# Patient Record
Sex: Male | Born: 1945 | Race: White | Hispanic: No | Marital: Married | Smoking: Never smoker
Health system: Southern US, Community
[De-identification: ages and names within clinical notes are randomized; demographics above are authoritative.]

## PROBLEM LIST (undated history)

## (undated) DIAGNOSIS — E785 Hyperlipidemia, unspecified: Secondary | ICD-10-CM

## (undated) DIAGNOSIS — I2510 Atherosclerotic heart disease of native coronary artery without angina pectoris: Secondary | ICD-10-CM

## (undated) HISTORY — PX: CARDIAC CATHETERIZATION: SHX172

---

## 2020-01-01 ENCOUNTER — Ambulatory Visit (INDEPENDENT_AMBULATORY_CARE_PROVIDER_SITE_OTHER): Payer: Medicare HMO

## 2020-01-01 ENCOUNTER — Encounter: Payer: Self-pay | Admitting: Orthopaedic Surgery

## 2020-01-01 ENCOUNTER — Ambulatory Visit: Payer: Medicare HMO | Admitting: Orthopaedic Surgery

## 2020-01-01 DIAGNOSIS — M25571 Pain in right ankle and joints of right foot: Secondary | ICD-10-CM | POA: Diagnosis not present

## 2020-01-01 DIAGNOSIS — G8929 Other chronic pain: Secondary | ICD-10-CM | POA: Diagnosis not present

## 2020-01-01 DIAGNOSIS — M25561 Pain in right knee: Secondary | ICD-10-CM

## 2020-01-01 MED ORDER — BUPIVACAINE HCL 0.5 % IJ SOLN
2.0000 mL | INTRAMUSCULAR | Status: AC | PRN
  Administered 2020-01-01: 2 mL via INTRA_ARTICULAR

## 2020-01-01 MED ORDER — LIDOCAINE HCL 1 % IJ SOLN
2.0000 mL | INTRAMUSCULAR | Status: AC | PRN
  Administered 2020-01-01: 2 mL

## 2020-01-01 MED ORDER — METHYLPREDNISOLONE ACETATE 40 MG/ML IJ SUSP
40.0000 mg | INTRAMUSCULAR | Status: AC | PRN
  Administered 2020-01-01: 40 mg via INTRA_ARTICULAR

## 2020-01-01 NOTE — Progress Notes (Signed)
Office Visit Note   Patient: Jesus Edwards           Date of Birth: 25-Mar-1945           MRN: [id number] Visit Date: 01/01/2020              Requested by: No referring provider defined for this encounter. PCP: No primary care provider on file.   Assessment & Plan: Visit Diagnoses:  1. Pain in right ankle and joints of right foot   2. Chronic pain of right knee     Plan: Impression is chronic right knee and ankle pain.  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  I was able to aspirate approximately 25 cc of joint fluid from the right knee and cortisone was injected.  I am concerned that he may have a meniscal tear in addition to the degenerative joint disease therefore we will order an MRI of the right knee to evaluate for this.  For the ankle cortisone injections have given temporary relief and I suspect that there may be loose bodies in the joint therefore we will order an MRI of the right ankle as well.  Follow-up after the MRIs.  Follow-Up Instructions: Return if symptoms worsen or fail to improve.   Orders:  Orders Placed This Encounter  Procedures  . XR KNEE 3 VIEW RIGHT  . XR Ankle Complete Right   No orders of the defined types were placed in this encounter.     Procedures: Large Joint Inj: R knee on 01/01/2020 4:58 PM Indications: pain Details: 22 G needle  Arthrogram: No  Medications: 40 mg methylPREDNISolone acetate 40 MG/ML; 2 mL lidocaine 1 %; 2 mL bupivacaine 0.5 % Consent was given by the patient. Patient was prepped and draped in the usual sterile fashion.       Clinical Data: No additional findings.   Subjective: Chief Complaint  Patient presents with  . Right Ankle - Pain  . Right Knee - Pain    Jesus Edwards is a 74 year old gentleman here for second opinion for evaluation of chronic right knee and right ankle pain.  She he had an injury in February of this year which she had a mechanical fall.  He was originally evaluated  comp rehab at [facility] and was unhappy with the care.  He did undergo knee aspiration injection which helped temporarily.  He has noticed that he continues to have pain that radiates down the leg and start up stiffness and pain and he will walk with the foot turned externally.  He denies any numbness and tingling or back pain.  Tylenol as needed.  He took tramadol originally but no longer takes it.   Review of Systems  Constitutional: Negative.   All other systems reviewed and are negative.    Objective: Vital Signs: There were no vitals taken for this visit.  Physical Exam Vitals and nursing note reviewed.  Constitutional:      Appearance: He is well-developed.  HENT:     Head: Normocephalic and atraumatic.  Eyes:     Pupils: Pupils are equal, round, and reactive to light.  Pulmonary:     Effort: Pulmonary effort is normal.  Abdominal:     Palpations: Abdomen is soft.  Musculoskeletal:        General: Normal range of motion.     Cervical back: Neck supple.  Skin:    General: Skin is warm.  Neurological:  Mental Status: He is alert and oriented to person, place, and time.  Psychiatric:        Behavior: Behavior normal.        Thought Content: Thought content normal.        Judgment: Judgment normal.     Ortho Exam Right knee shows a moderate joint effusion with a slight flexion contracture.  Collaterals and cruciates are stable.  2+ patellofemoral crepitus.  Right ankle shows no effusion.  He has no significant limitation range of motion.  No tenderness palpation.  Specialty Comments:  No specialty comments available.  Imaging: XR Ankle Complete Right  Result Date: 01/01/2020 Moderate ankle arthritis possible loose bodies.  XR KNEE 3 VIEW RIGHT  Result Date: 01/01/2020 Advanced patellofemoral arthritis.  Moderate femoral-tibial arthritis.    PMFS History: There are no problems to display for this patient.  History reviewed. No pertinent past medical  history.  History reviewed. No pertinent family history.  History reviewed. No pertinent surgical history. Social History   Occupational History  . Not on file  Tobacco Use  . Smoking status: Never Smoker  . Smokeless tobacco: Never Used  Substance and Sexual Activity  . Alcohol use: Not on file  . Drug use: Not on file  . Sexual activity: Not on file

## 2020-01-02 NOTE — Addendum Note (Signed)
Addended by: Albertina Parr on: 01/02/2020 11:17 AM   Modules accepted: Orders

## 2020-01-16 ENCOUNTER — Telehealth: Payer: Self-pay

## 2020-01-16 NOTE — Telephone Encounter (Signed)
Please advise. Thanks.  

## 2020-01-16 NOTE — Telephone Encounter (Signed)
Patient called he stated his knee is in pain he is out of town and is requesting rx refill for tramadol he is requesting rx to be sent to [facility] in [place]. Pharmacy number:[phone] Pharmacy address: [address], [place], [place] [address] [phone]

## 2020-01-17 ENCOUNTER — Other Ambulatory Visit: Payer: Self-pay | Admitting: Physician Assistant

## 2020-01-17 MED ORDER — TRAMADOL HCL 50 MG PO TABS
50.0000 mg | ORAL_TABLET | Freq: Two times a day (BID) | ORAL | 0 refills | Status: DC | PRN
Start: 2020-01-17 — End: 2020-01-21

## 2020-01-17 NOTE — Telephone Encounter (Signed)
I just sent in

## 2020-01-21 ENCOUNTER — Telehealth: Payer: Self-pay

## 2020-01-21 ENCOUNTER — Other Ambulatory Visit: Payer: Self-pay | Admitting: Physician Assistant

## 2020-01-21 MED ORDER — TRAMADOL HCL 50 MG PO TABS
50.0000 mg | ORAL_TABLET | Freq: Two times a day (BID) | ORAL | 0 refills | Status: DC | PRN
Start: 2020-01-21 — End: 2020-04-04

## 2020-01-21 NOTE — Telephone Encounter (Signed)
Patient aware this was called in  

## 2020-01-21 NOTE — Telephone Encounter (Signed)
Patient called in for refill on tramdol. Says he is in [place] right now and would like to [facility]    [address] [address], [place], [place] [address] CB:[phone]

## 2020-01-21 NOTE — Telephone Encounter (Signed)
I just sent in

## 2020-01-21 NOTE — Telephone Encounter (Signed)
See below

## 2020-01-31 ENCOUNTER — Ambulatory Visit
Admission: RE | Admit: 2020-01-31 | Discharge: 2020-01-31 | Disposition: A | Discharge status: Home / Self Care | Payer: Medicare HMO | Source: Ambulatory Visit | Attending: Orthopaedic Surgery | Admitting: Orthopaedic Surgery

## 2020-01-31 DIAGNOSIS — G8929 Other chronic pain: Secondary | ICD-10-CM

## 2020-01-31 DIAGNOSIS — M25571 Pain in right ankle and joints of right foot: Secondary | ICD-10-CM

## 2020-01-31 IMAGING — MR MR KNEE*R* W/O CM
4 of 6 series · 18 of 40 positions shown · non-contrast
Comparison: [DATE] radiographs

CLINICAL DATA: Right knee pain. Fall 2 years ago. Weakness.
Osteoarthritis.

EXAM:
MRI OF THE RIGHT KNEE WITHOUT CONTRAST
TECHNIQUE: Multiplanar, multisequence MR imaging of the knee was performed. No
intravenous contrast was administered.

[Series 3: T2 fat-sat · axial · 4.0mm · 0.29mm/px · z∈[-64,+24]mm · 3 of 30 slices shown (1 of 2)]
[im 5/30]
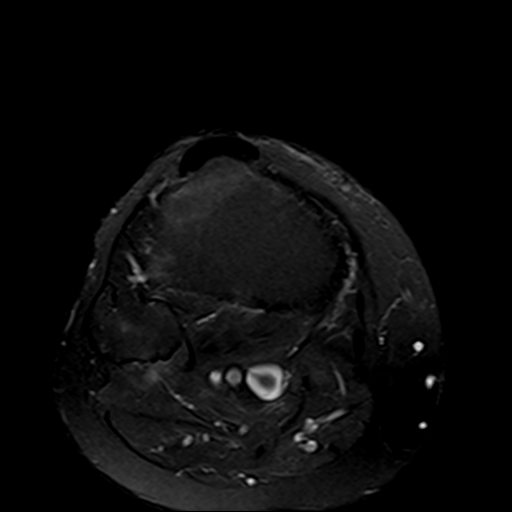
[im 15/30]
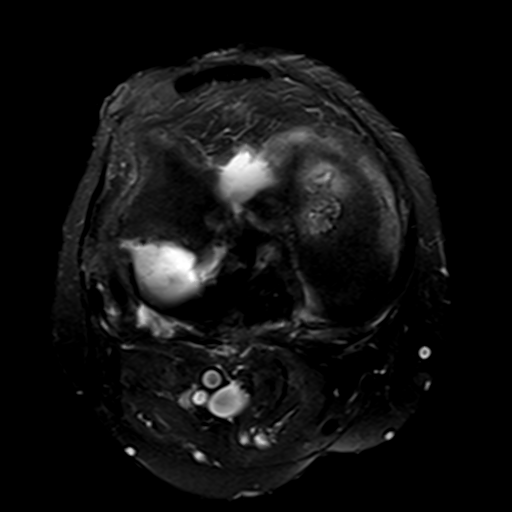
[im 25/30]
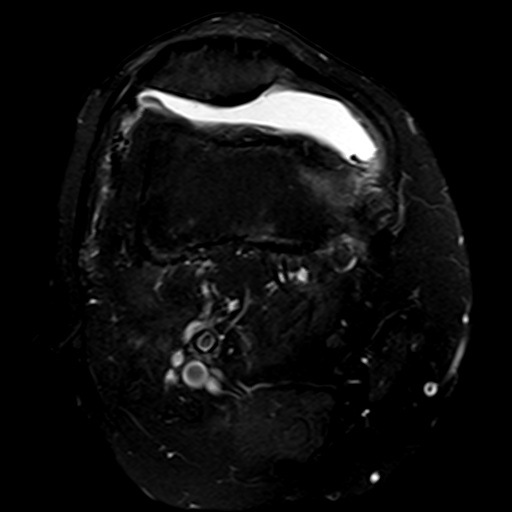

[Series 5: T2 fat-sat · coronal · 4.0mm · 0.29mm/px · 3 of 27 slices shown (2 of 2)]
[im 6/27]
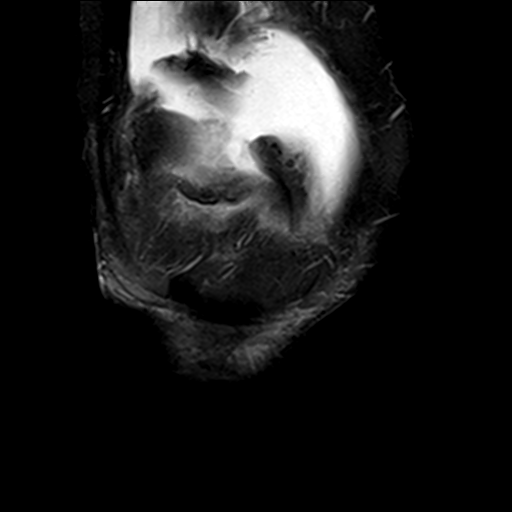
[im 16/27]
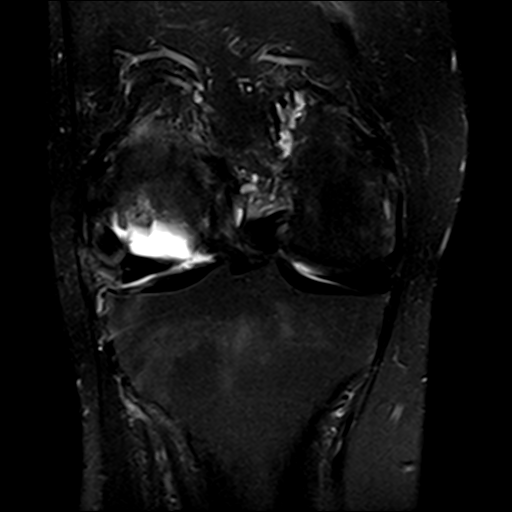
[im 27/27]
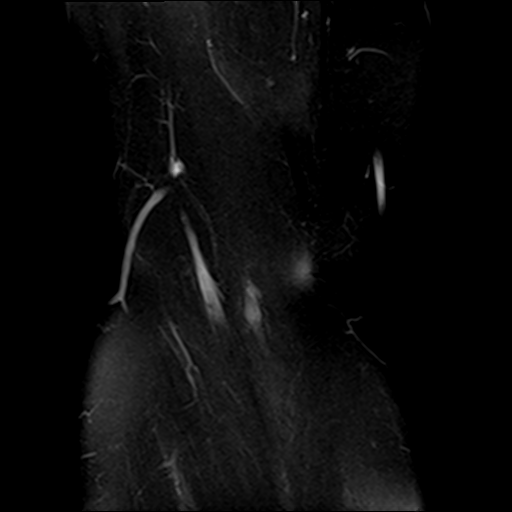

[Series 6: PD fat-sat · coronal · 3.0mm · 0.29mm/px · 7 of 28 slices shown (1 of 2)]
[im 1/28]
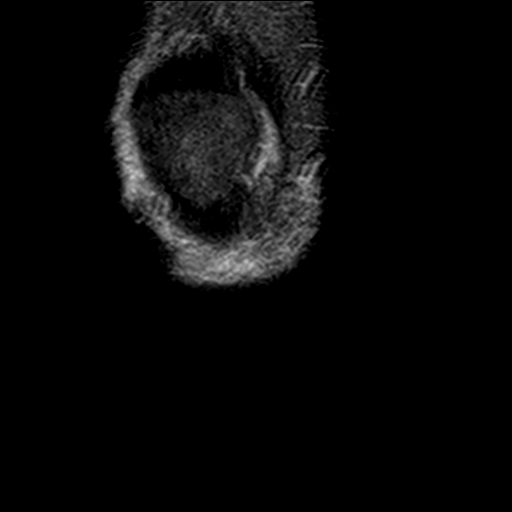
[im 5/28]
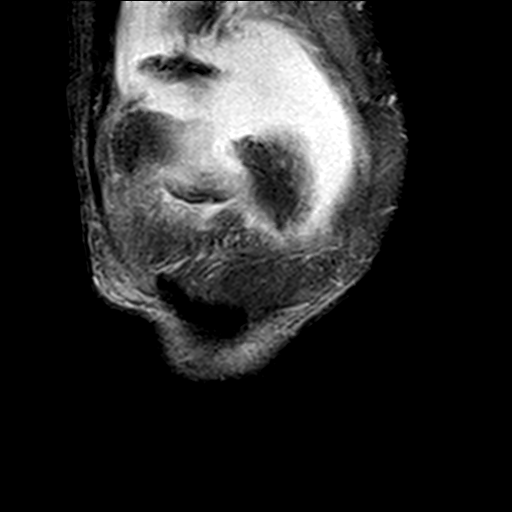
[im 10/28]
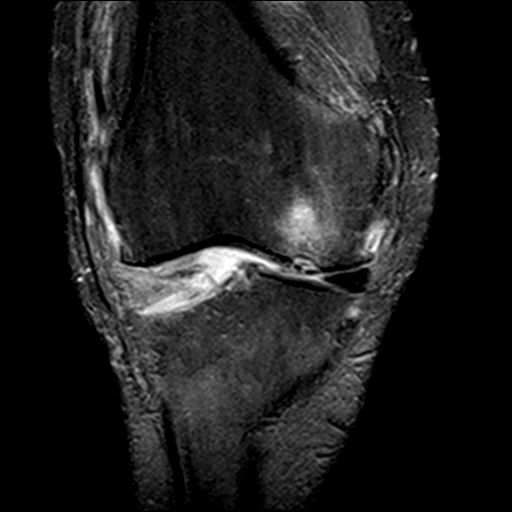
[im 14/28]
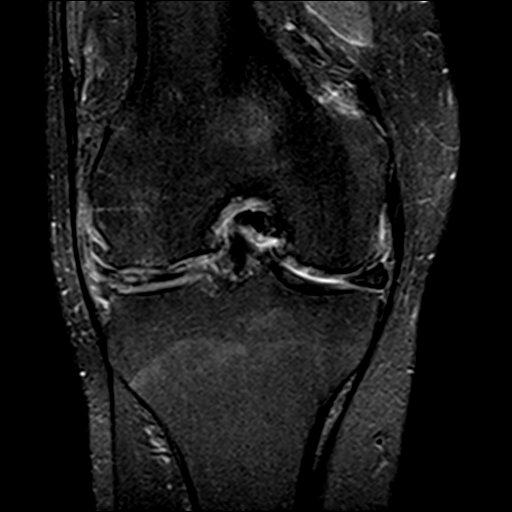
[im 19/28]
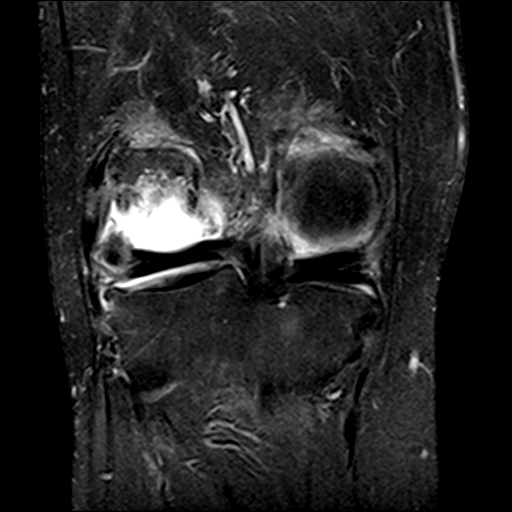
[im 23/28]
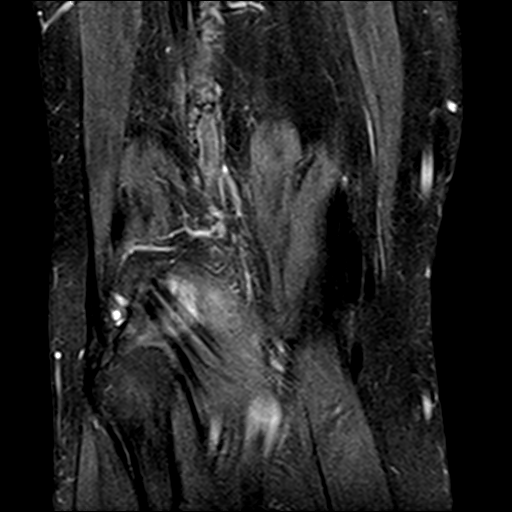
[im 28/28]
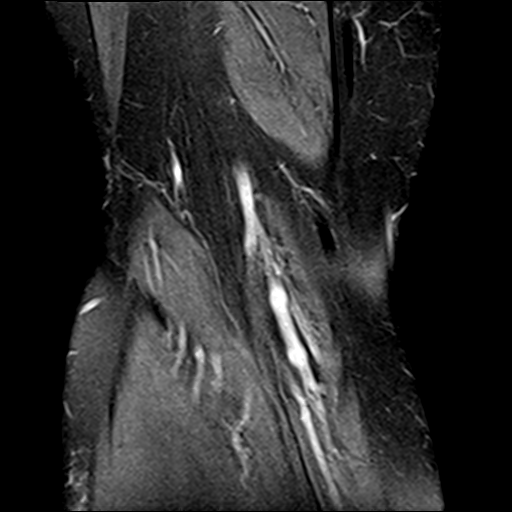

[Series 9: PD fat-sat · sagittal · 3.0mm · 0.29mm/px · 5 of 30 slices shown (2 of 2)]
[im 1/30]
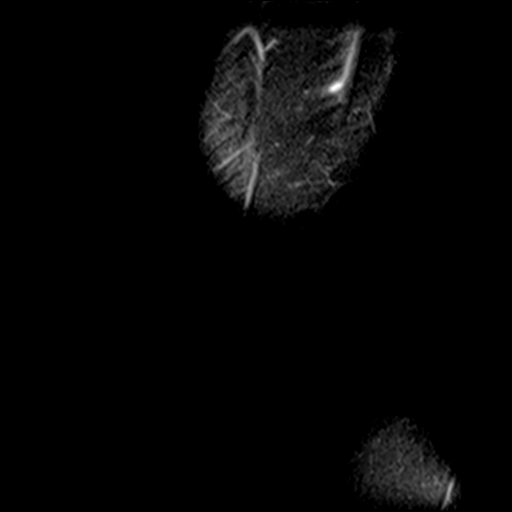
[im 5/30]
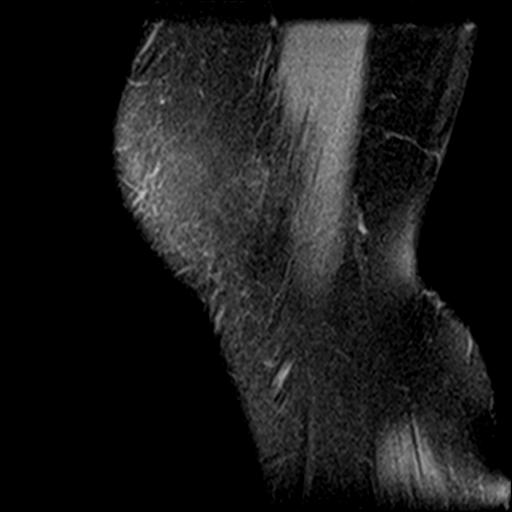
[im 10/30]
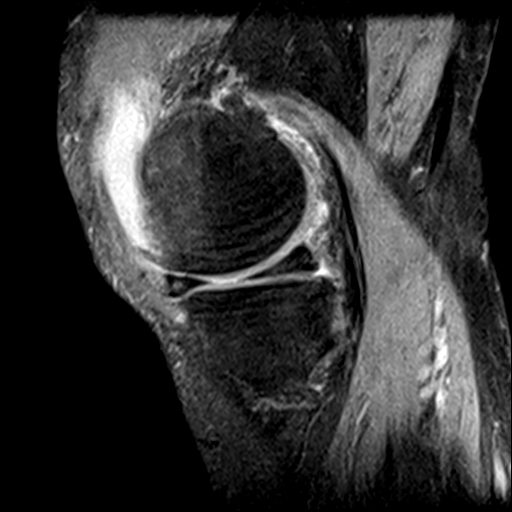
[im 15/30]
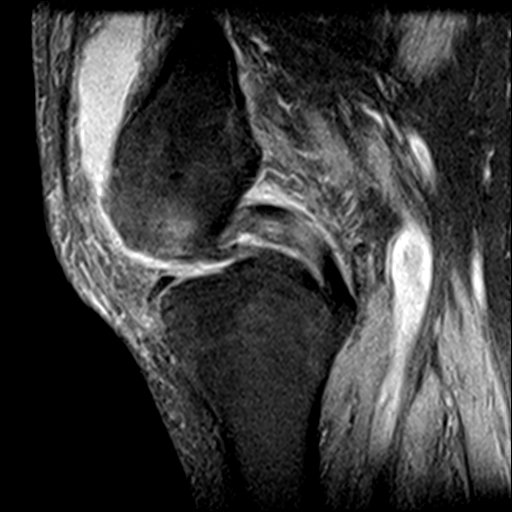
[im 25/30]
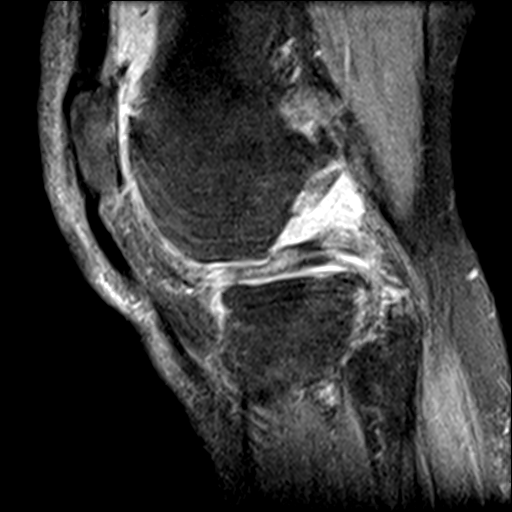

[18 of 40 positions shown; findings below may reference images not displayed]

FINDINGS: Despite efforts by the technologist and patient, motion artifact is
present on today's exam and could not be eliminated. This reduces
exam sensitivity and specificity.

MENISCI

Medial meniscus: Grade 1 signal and expansion in the posterior horn
and midbody without a well-defined surface tear.

Lateral meniscus: Degenerative tearing of the anterior horn
primarily involving the superior surface.

LIGAMENTS

Cruciates: Accentuated signal proximally in the PCL compatible with
PCL degeneration.

Collaterals:  Unremarkable

CARTILAGE

Patellofemoral: Severe full-thickness loss of articular cartilage
along the patella (especially the lateral facet and posterior ridge)
and laterally in the femoral trochlear groove. Marginal spurring.
Foci of subcortical marrow edema are noted laterally in the
patellofemoral joint.

Medial: Mild degenerative chondral thinning. 1.9 by 1.4 cm
osteochondral lesion of the medial femoral condyle anteriorly on
image 18 series 9 and image 18 series 6 with surrounding marrow
edema, fluid signal undercuts the articular cartilage and possibly
part of the cortex as shown on image 18 series 9, but no loose
fragment is identified.

Lateral: Substantial osteochondral defect involving the posterior
third of the lateral femoral condyle as shown on image 7 of series
9, possibly from a remote fracture or impaction injury, questionable
2.1 by 0.6 cm residual fragment along the bed of the defect on image
6 of series 9.

Joint:  Small to moderate knee effusion.

Popliteal Fossa:  Unremarkable

Extensor Mechanism:  Extensor unremarkable

Bones:  No additional bony findings.

Other: No supplemental non-categorized findings.
IMPRESSION: 1. Degenerative tearing of the anterior horn of the lateral meniscus
primarily involving the superior surface.
2. 1.9 by 1.4 cm osteochondral lesion of the medial femoral condyle
anteriorly with surrounding marrow edema, but no loose/unstable
fragment identified.
3. Large defect involving the posterior third of the lateral femoral
condyle with flattened appearance with loss of bony volume, possibly
from a remote fracture or impaction injury, and questionable 2.1 by
0.6 cm residual fragment along the bed of the defect.
4. Severe full-thickness loss of articular cartilage along the
patella and laterally in the femoral trochlear groove. Mild
degenerative chondral thinning in the medial compartment.
5. Small to moderate knee effusion.
6. PCL degeneration.
7. Despite efforts by the technologist and patient, motion artifact
is present on today's exam and could not be eliminated. This reduces
exam sensitivity and specificity.

## 2020-01-31 IMAGING — MR MR ANKLE*R* W/O CM
5 series · 39 of 40 positions shown · non-contrast
Comparison: [DATE] radiographs

CLINICAL DATA: Fall 2 years ago, right leg pain and weakness.

EXAM:
MRI OF THE RIGHT ANKLE WITHOUT CONTRAST
TECHNIQUE: Multiplanar, multisequence MR imaging of the ankle was performed. No
intravenous contrast was administered.

[Series 4: T2 fat-sat · axial · 3.0mm · 0.50mm/px · z∈[-81,+47]mm · 9 of 34 slices shown (1 of 2)]
[im 1/34]
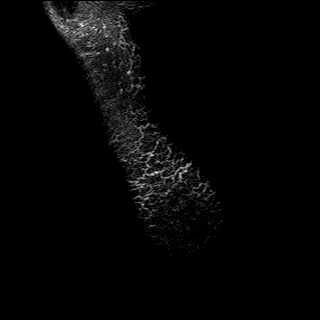
[im 5/34]
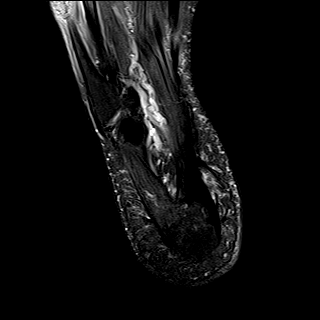
[im 9/34]
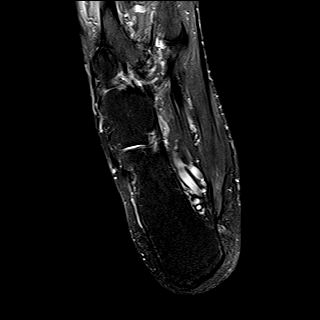
[im 13/34]
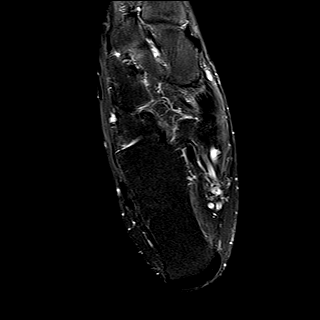
[im 17/34]
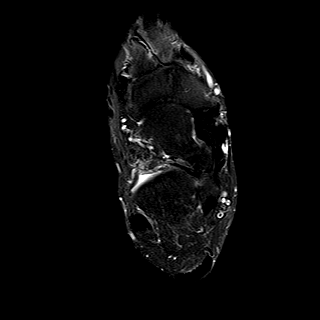
[im 21/34]
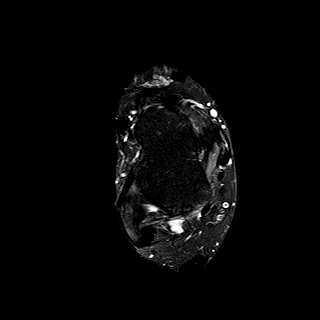
[im 25/34]
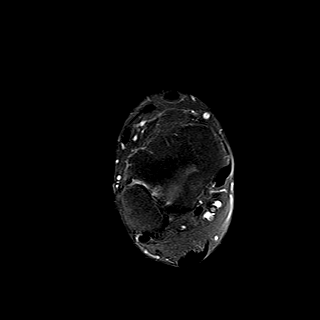
[im 29/34]
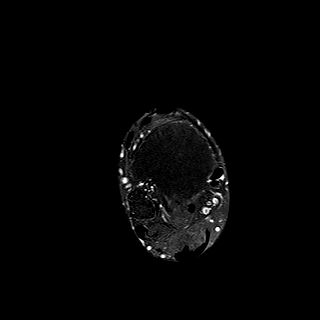
[im 34/34]
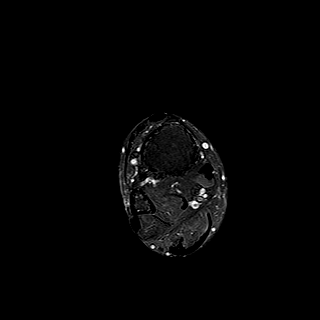

[Series 5: PD fat-sat · axial · 3.0mm · 0.50mm/px · z∈[-81,+47]mm · 9 of 34 slices shown]
[im 1/34]
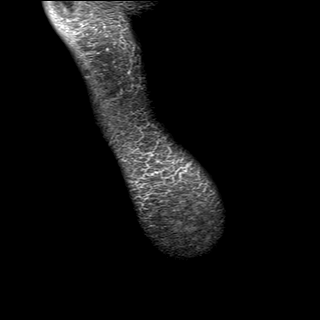
[im 4/34]
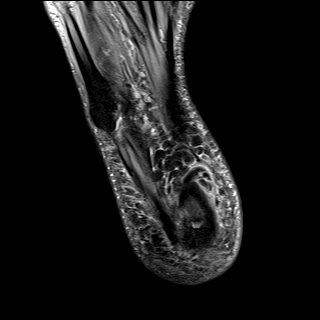
[im 8/34]
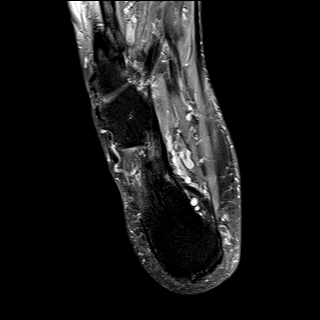
[im 12/34]
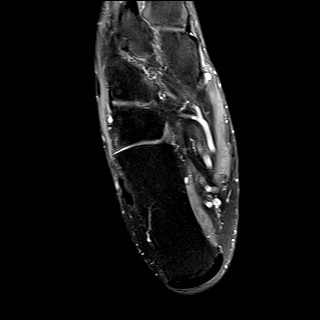
[im 15/34]
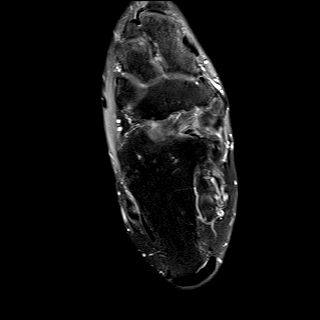
[im 19/34]
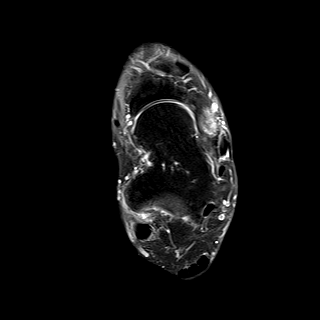
[im 23/34]
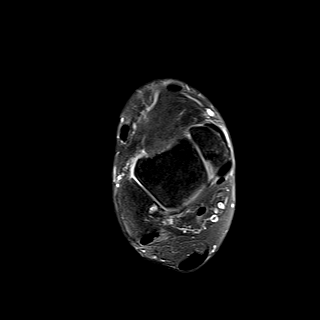
[im 30/34]
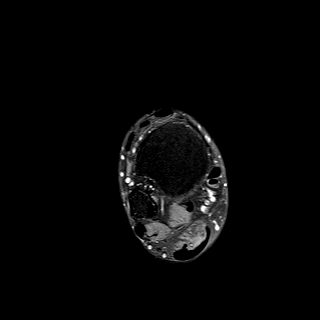
[im 34/34]
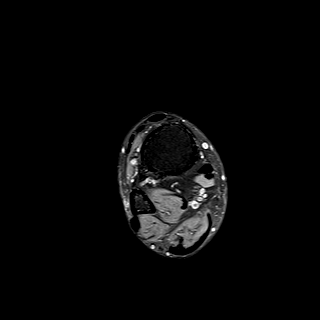

[Series 6: T1 · sagittal · 4.0mm · 0.56mm/px · 5 of 15 slices shown]
[im 1/15]
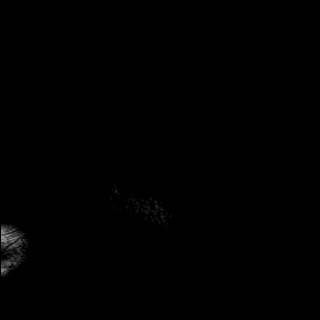
[im 4/15]
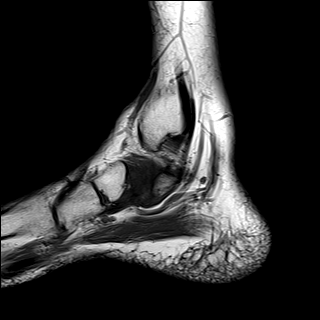
[im 8/15]
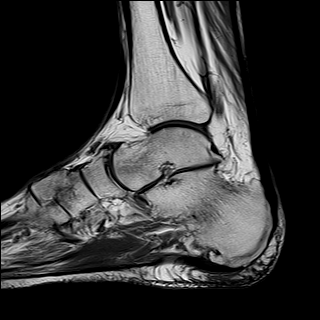
[im 11/15]
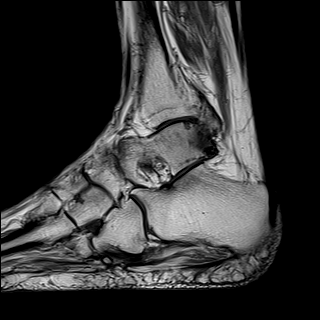
[im 15/15]
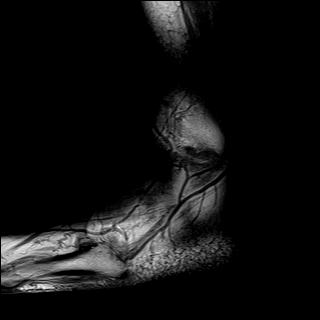

[Series 7: STIR · sagittal · 4.0mm · 0.35mm/px · 5 of 15 slices shown]
[im 1/15]
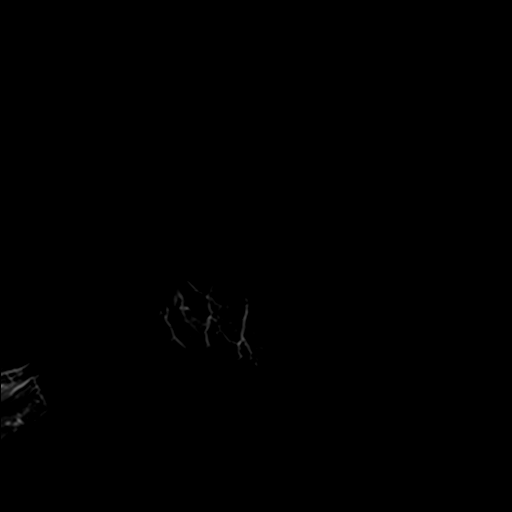
[im 4/15]
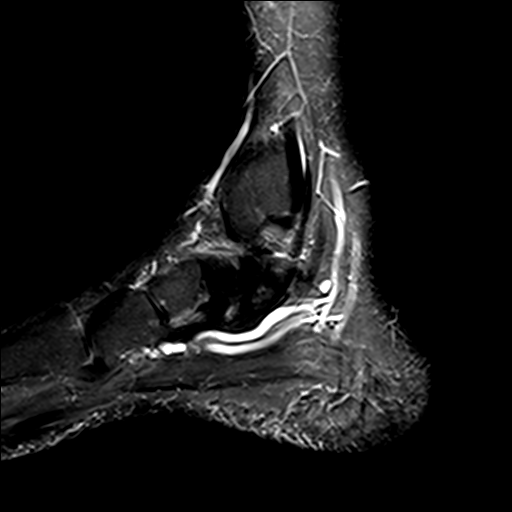
[im 8/15]
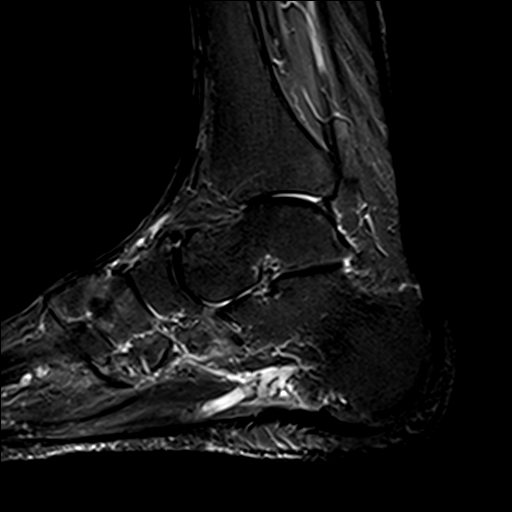
[im 11/15]
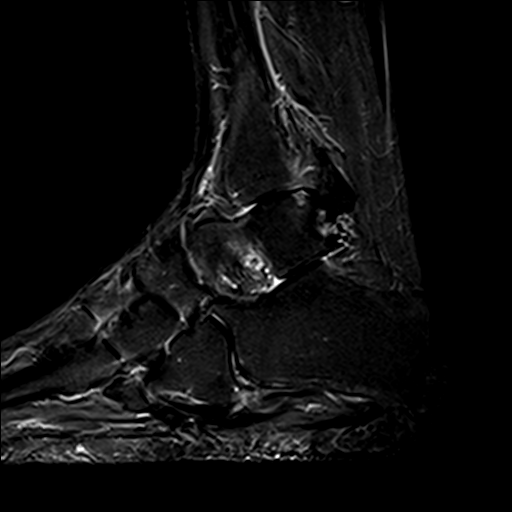
[im 15/15]
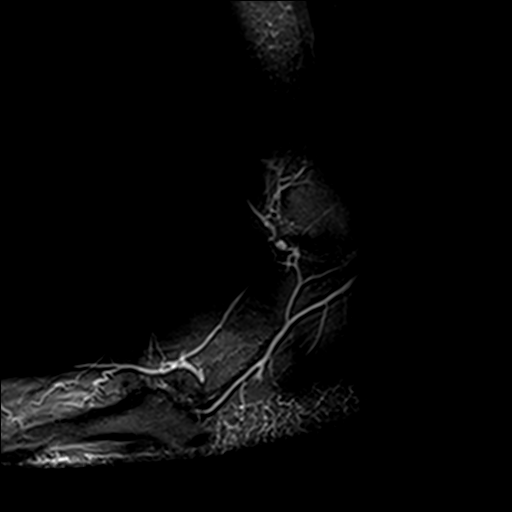

[Series 8: T2 fat-sat · coronal · 3.0mm · 0.56mm/px · 11 of 35 slices shown (2 of 2)]
[im 1/35]
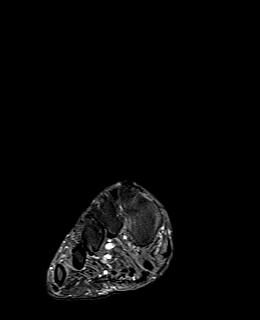
[im 4/35]
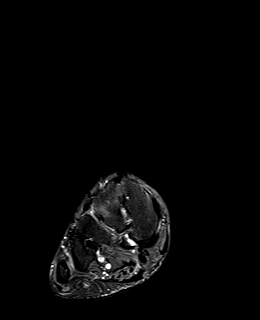
[im 7/35]
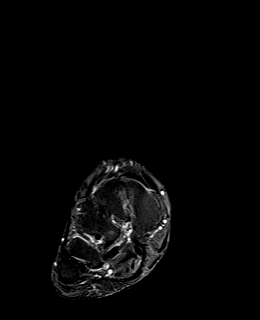
[im 11/35]
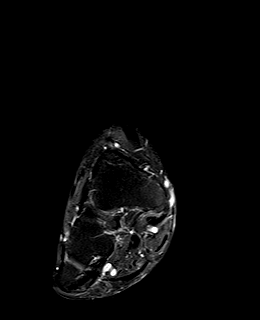
[im 14/35]
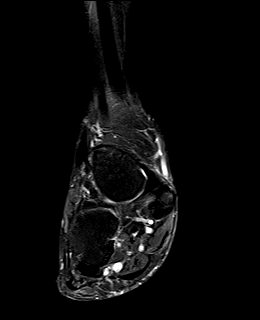
[im 18/35]
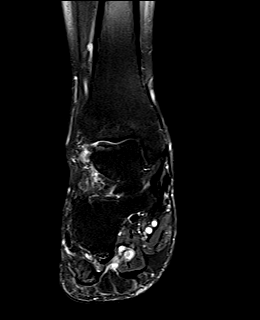
[im 21/35]
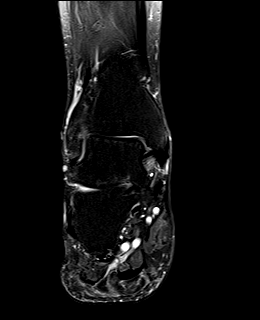
[im 24/35]
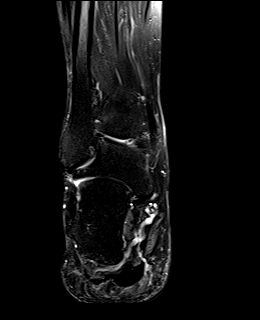
[im 28/35]
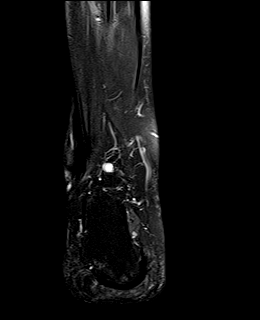
[im 31/35]
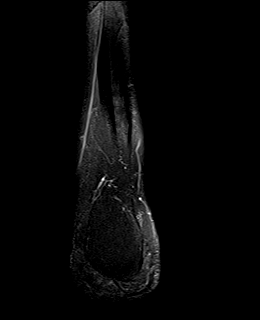
[im 35/35]
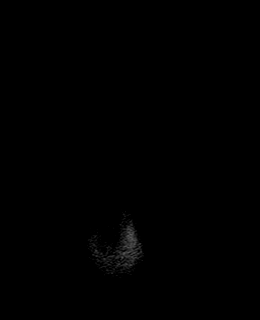

[39 of 40 positions shown; findings below may reference images not displayed]

FINDINGS: TENDONS

Peroneal: Peroneus longus tendinopathy along a 2 cm segment just
distal to the peroneal tubercle.

Posteromedial: Distal tibialis posterior tenosynovitis and
tendinopathy, correlate clinically in assessing for tibialis
posterior dysfunction.

Anterior: Unremarkable

Achilles: Unremarkable

Plantar Fascia: Abnormal thickening of the medial band of the
plantar fascia favoring plantar fasciitis. Notable plantar calcaneal
spur.

LIGAMENTS

Lateral: Unremarkable

Medial: Thickened appearance of the superomedial portion of the
spring ligament. Deltoid ligament intact.

CARTILAGE

Ankle Joint: History 0.4 cm non-fragmented osteochondral lesion of
the lateral talar dome, image 13 series 8. Moderate degenerative
chondral thinning with faint endosteal edema along the talar dome.

Subtalar Joints/Sinus Tarsi: Is moderate degenerative chondral
thinning

Bones: Small accessory navicular. Mild dorsal fragmented spurring at
the talonavicular articulation.

Other: No supplemental non-categorized findings.
IMPRESSION: 1. Distal tibialis posterior tenosynovitis and tendinopathy,
correlate clinically in assessing for tibialis posterior
dysfunction. There is adjacent thickening of the superomedial
portion of the spring ligament.
2. Peroneus longus tendinopathy along a 2 cm segment just distal to
the peroneal tubercle.
3. Moderate degenerative chondral thinning in the tibiotalar joint
and subtalar joints. Small non-fragmented osteochondral lesion of
the lateral talar dome.

## 2020-02-12 ENCOUNTER — Other Ambulatory Visit: Payer: Self-pay

## 2020-02-12 ENCOUNTER — Encounter: Payer: Self-pay | Admitting: Orthopaedic Surgery

## 2020-02-12 ENCOUNTER — Ambulatory Visit: Payer: Medicare HMO | Admitting: Orthopaedic Surgery

## 2020-02-12 DIAGNOSIS — M1711 Unilateral primary osteoarthritis, right knee: Secondary | ICD-10-CM | POA: Diagnosis not present

## 2020-02-12 DIAGNOSIS — M25571 Pain in right ankle and joints of right foot: Secondary | ICD-10-CM | POA: Diagnosis not present

## 2020-02-12 MED ORDER — BUPIVACAINE HCL 0.5 % IJ SOLN
1.0000 mL | INTRAMUSCULAR | Status: AC | PRN
  Administered 2020-02-12: 1 mL via INTRA_ARTICULAR

## 2020-02-12 MED ORDER — LIDOCAINE HCL 1 % IJ SOLN
1.0000 mL | INTRAMUSCULAR | Status: AC | PRN
  Administered 2020-02-12: 1 mL

## 2020-02-12 MED ORDER — METHYLPREDNISOLONE ACETATE 40 MG/ML IJ SUSP
40.0000 mg | INTRAMUSCULAR | Status: AC | PRN
  Administered 2020-02-12: 40 mg via INTRA_ARTICULAR

## 2020-02-12 NOTE — Progress Notes (Signed)
Office Visit Note   Patient: Jesus Edwards           Date of Birth: [birth year]           MRN: [id number] Visit Date: 02/12/2020              Requested by: Lanna Poche, MD [facility] [place],  [place] [address] PCP: Lanna Poche, [place]   Assessment & Plan: Visit Diagnoses:  1. Pain in right ankle and joints of right foot   2. Primary osteoarthritis of right knee     Plan: MRI of the right knee shows essentially tricompartmental degenerative changes with OCD lesions the femoral condyles.  MRI of the right ankle shows moderate ankle and subtalar joint arthrosis and tendinosis of the peroneus longus and nonspecific thickening of the posterior tibial tendon.  Based on findings I feel that the peroneal tendinopathy is most symptomatic and he is requesting injection today.  I have also brought up the possibility of wearing a boot if he wants to try to immobilize it.  It is too soon to repeat the knee injection today.  He understands that a total knee replacement will be the permanent solution for his knee problems.  He tolerated the injection well today.  We will see him back as needed.  Follow-Up Instructions: Return if symptoms worsen or fail to improve.   Orders:  No orders of the defined types were placed in this encounter.  No orders of the defined types were placed in this encounter.     Procedures: Medium Joint Inj: R ankle on 02/12/2020 9:10 AM Indications: pain Details: 25 G needle Medications: 1 mL lidocaine 1 %; 40 mg methylPREDNISolone acetate 40 MG/ML; 1 mL bupivacaine 0.5 % Outcome: tolerated well, no immediate complications Patient was prepped and draped in the usual sterile fashion.       Clinical Data: No additional findings.   Subjective: Chief Complaint  Patient presents with  . Right Knee - Follow-up    MRI review  . Right Ankle - Follow-up    MRI review    Jesus Edwards is here for review of his right knee and right ankle MRI.  His right knee is  feeling better since we aspirated and injected it about 6 weeks ago.  His main complaint is the right ankle pain.  The pain is mainly lateral along the peroneal tendons distal to the distal fibula.   Review of Systems   Objective: Vital Signs: There were no vitals taken for this visit.  Physical Exam  Ortho Exam Right knee shows a small joint effusion.  Otherwise exam is unchanged.  Right ankle exam shows no swelling.  He has no tenderness along the posterior tibial tendon.  He does have tenderness along the peroneal tendons lateral to the calcaneus and the peroneal tubercle.  Peroneal tendons are stable. Specialty Comments:  No specialty comments available.  Imaging: No results found.   PMFS History: Patient Active Problem List   Diagnosis Date Noted  . Pain in right ankle and joints of right foot 02/12/2020  . Primary osteoarthritis of right knee 02/12/2020   History reviewed. No pertinent past medical history.  History reviewed. No pertinent family history.  History reviewed. No pertinent surgical history. Social History   Occupational History  . Not on file  Tobacco Use  . Smoking status: Never Smoker  . Smokeless tobacco: Never Used  Substance and Sexual Activity  . Alcohol use: Not on file  . Drug  use: Not on file  . Sexual activity: Not on file

## 2020-03-06 ENCOUNTER — Ambulatory Visit (INDEPENDENT_AMBULATORY_CARE_PROVIDER_SITE_OTHER): Payer: Medicare HMO | Admitting: Orthopaedic Surgery

## 2020-03-06 ENCOUNTER — Other Ambulatory Visit: Payer: Self-pay

## 2020-03-06 ENCOUNTER — Encounter: Payer: Self-pay | Admitting: Orthopaedic Surgery

## 2020-03-06 VITALS — Ht 68.0 in | Wt 210.0 lb

## 2020-03-06 DIAGNOSIS — S86311A Strain of muscle(s) and tendon(s) of peroneal muscle group at lower leg level, right leg, initial encounter: Secondary | ICD-10-CM | POA: Diagnosis not present

## 2020-03-06 NOTE — Progress Notes (Signed)
Office Visit Note   Patient: Jesus Edwards           Date of Birth: [birth year]           MRN: [id number] Visit Date: 03/06/2020              Requested by: Lanna Poche, MD [facility] [place],  [place] [address] PCP: Lanna Poche, MD   Assessment & Plan: Visit Diagnoses:  1. Peroneal tendon tear, right, initial encounter     Plan: Impression is symptomatic right peroneus longus tendinopathy with temporary relief from cortisone injection.  We talked about continuing conservative treatment in terms of immobilization with a boot or repeating the cortisone injection versus surgical debridement and repair as indicated the possibility of tenodesis to the peroneus brevis tendon based on intraoperative findings.  After consideration of his options he elected to proceed with surgical treatment.  Risk benefits rehab recovery reviewed with the patient.  He will like to do this in the near future.  Questions encouraged and answered.  Follow-Up Instructions: Return if symptoms worsen or fail to improve.   Orders:  No orders of the defined types were placed in this encounter.  No orders of the defined types were placed in this encounter.     Procedures: No procedures performed   Clinical Data: No additional findings.   Subjective: Chief Complaint  Patient presents with  . Right Ankle - Pain    Jesus Edwards returns today for follow-up of right ankle pain due to peroneal tendinopathy.  He states that the injection worked really well for about a week but unfortunately wore off after that.  He states that he has some occasional pain in the posterior medial side of the ankle but this is very mild.   Review of Systems  Constitutional: Negative.   All other systems reviewed and are negative.    Objective: Vital Signs: Ht 5\' 8"  (1.727 m)   Wt 210 lb (95.3 kg)   BMI 31.93 kg/m   Physical Exam Vitals and nursing note reviewed.  Constitutional:      Appearance: He is  well-developed and well-nourished.  Pulmonary:     Effort: Pulmonary effort is normal.  Abdominal:     Palpations: Abdomen is soft.  Skin:    General: Skin is warm.  Neurological:     Mental Status: He is alert and oriented to person, place, and time.  Psychiatric:        Mood and Affect: Mood and affect normal.        Behavior: Behavior normal.        Thought Content: Thought content normal.        Judgment: Judgment normal.     Ortho Exam Examination of the right ankle shows no swelling.  No neurovascular compromise.  He has tenderness along the peroneal tendons just on the lateral side of his foot distal to the lateral malleolus.  He has no pain with ankle or subtalar motion.  Peroneal tendons are stable without subluxation. Specialty Comments:  No specialty comments available.  Imaging: No results found.   PMFS History: Patient Active Problem List   Diagnosis Date Noted  . Peroneal tendon tear, right, initial encounter 03/06/2020  . Pain in right ankle and joints of right foot 02/12/2020  . Primary osteoarthritis of right knee 02/12/2020   History reviewed. No pertinent past medical history.  History reviewed. No pertinent family history.  History reviewed. No pertinent surgical history. Social History  Occupational History  . Not on file  Tobacco Use  . Smoking status: Never Smoker  . Smokeless tobacco: Never Used  Substance and Sexual Activity  . Alcohol use: Not on file  . Drug use: Not on file  . Sexual activity: Not on file

## 2020-04-04 ENCOUNTER — Encounter (HOSPITAL_BASED_OUTPATIENT_CLINIC_OR_DEPARTMENT_OTHER): Payer: Self-pay | Admitting: Orthopaedic Surgery

## 2020-04-08 ENCOUNTER — Other Ambulatory Visit (HOSPITAL_COMMUNITY): Payer: Medicare HMO

## 2020-04-09 ENCOUNTER — Other Ambulatory Visit (HOSPITAL_COMMUNITY)
Admission: RE | Admit: 2020-04-09 | Discharge: 2020-04-09 | Disposition: A | Discharge status: Home / Self Care | Payer: Medicare HMO | Source: Ambulatory Visit | Attending: Orthopaedic Surgery | Admitting: Orthopaedic Surgery

## 2020-04-09 DIAGNOSIS — Z01812 Encounter for preprocedural laboratory examination: Secondary | ICD-10-CM | POA: Diagnosis present

## 2020-04-09 DIAGNOSIS — Z20822 Contact with and (suspected) exposure to covid-19: Secondary | ICD-10-CM | POA: Insufficient documentation

## 2020-04-09 LAB — SARS CORONAVIRUS 2 (TAT 6-24 HRS): SARS Coronavirus 2: NEGATIVE

## 2020-04-09 NOTE — Progress Notes (Signed)
Patient's chart and recent cardiac noted from [place] in [place] reviewed with Dr Chaney Malling. 11-12-19 ECHO results reviewed as well. OK for [facility].

## 2020-04-11 ENCOUNTER — Ambulatory Visit (HOSPITAL_BASED_OUTPATIENT_CLINIC_OR_DEPARTMENT_OTHER): Payer: Medicare HMO | Admitting: Certified Registered"

## 2020-04-11 ENCOUNTER — Other Ambulatory Visit: Payer: Self-pay

## 2020-04-11 ENCOUNTER — Encounter (HOSPITAL_BASED_OUTPATIENT_CLINIC_OR_DEPARTMENT_OTHER): Payer: Self-pay | Admitting: Orthopaedic Surgery

## 2020-04-11 ENCOUNTER — Encounter (HOSPITAL_BASED_OUTPATIENT_CLINIC_OR_DEPARTMENT_OTHER)
Admission: RE | Disposition: A | Discharge status: Home / Self Care | Payer: Self-pay | Source: Home / Self Care | Attending: Orthopaedic Surgery

## 2020-04-11 ENCOUNTER — Ambulatory Visit (HOSPITAL_BASED_OUTPATIENT_CLINIC_OR_DEPARTMENT_OTHER)
Admission: RE | Admit: 2020-04-11 | Discharge: 2020-04-11 | Disposition: A | Discharge status: Home / Self Care | Payer: Medicare HMO | Attending: Orthopaedic Surgery | Admitting: Orthopaedic Surgery

## 2020-04-11 DIAGNOSIS — X58XXXA Exposure to other specified factors, initial encounter: Secondary | ICD-10-CM | POA: Insufficient documentation

## 2020-04-11 DIAGNOSIS — M659 Synovitis and tenosynovitis, unspecified: Secondary | ICD-10-CM | POA: Insufficient documentation

## 2020-04-11 DIAGNOSIS — Z880 Allergy status to penicillin: Secondary | ICD-10-CM | POA: Diagnosis not present

## 2020-04-11 DIAGNOSIS — Z7982 Long term (current) use of aspirin: Secondary | ICD-10-CM | POA: Diagnosis not present

## 2020-04-11 DIAGNOSIS — M7671 Peroneal tendinitis, right leg: Secondary | ICD-10-CM | POA: Diagnosis not present

## 2020-04-11 DIAGNOSIS — S86391A Other injury of muscle(s) and tendon(s) of peroneal muscle group at lower leg level, right leg, initial encounter: Secondary | ICD-10-CM | POA: Insufficient documentation

## 2020-04-11 DIAGNOSIS — Y939 Activity, unspecified: Secondary | ICD-10-CM | POA: Diagnosis not present

## 2020-04-11 DIAGNOSIS — S86311A Strain of muscle(s) and tendon(s) of peroneal muscle group at lower leg level, right leg, initial encounter: Secondary | ICD-10-CM

## 2020-04-11 HISTORY — DX: Atherosclerotic heart disease of native coronary artery without angina pectoris: I25.10

## 2020-04-11 HISTORY — PX: POSTERIOR TIBIAL TENDON REPAIR: SHX6039

## 2020-04-11 HISTORY — DX: Hyperlipidemia, unspecified: E78.5

## 2020-04-11 SURGERY — REPAIR, TENDON, POSTERIOR TIBIAL
Anesthesia: General | Site: Ankle | Laterality: Right

## 2020-04-11 MED ORDER — OXYCODONE HCL 5 MG/5ML PO SOLN
5.0000 mg | Freq: Once | ORAL | Status: DC | PRN
Start: 2020-04-11 — End: 2020-04-11

## 2020-04-11 MED ORDER — ROPIVACAINE HCL 5 MG/ML IJ SOLN
INTRAMUSCULAR | Status: DC | PRN
  Administered 2020-04-11: 30 mL via PERINEURAL

## 2020-04-11 MED ORDER — CEFAZOLIN SODIUM-DEXTROSE 2-4 GM/100ML-% IV SOLN
2.0000 g | INTRAVENOUS | Status: AC
  Administered 2020-04-11: 2 g via INTRAVENOUS

## 2020-04-11 MED ORDER — AMISULPRIDE (ANTIEMETIC) 5 MG/2ML IV SOLN: 10.0000 mg | Freq: Once | INTRAVENOUS | Status: DC | PRN

## 2020-04-11 MED ORDER — HYDROCODONE-ACETAMINOPHEN 5-325 MG PO TABS: 1.0000 | ORAL_TABLET | Freq: Four times a day (QID) | ORAL | 0 refills | Status: AC | PRN

## 2020-04-11 MED ORDER — PROMETHAZINE HCL 25 MG/ML IJ SOLN
6.2500 mg | INTRAMUSCULAR | Status: DC | PRN
Start: 2020-04-11 — End: 2020-04-11

## 2020-04-11 MED ORDER — BUPIVACAINE HCL (PF) 0.5 % IJ SOLN
INTRAMUSCULAR | Status: AC
  Filled 2020-04-11: qty 30

## 2020-04-11 MED ORDER — LACTATED RINGERS IV SOLN: INTRAVENOUS | Status: DC

## 2020-04-11 MED ORDER — MIDAZOLAM HCL 2 MG/2ML IJ SOLN
INTRAMUSCULAR | Status: AC
  Filled 2020-04-11: qty 2

## 2020-04-11 MED ORDER — FENTANYL CITRATE (PF) 100 MCG/2ML IJ SOLN
50.0000 ug | Freq: Once | INTRAMUSCULAR | Status: AC
  Administered 2020-04-11: 50 ug via INTRAVENOUS

## 2020-04-11 MED ORDER — PROPOFOL 10 MG/ML IV BOLUS
INTRAVENOUS | Status: DC | PRN
  Administered 2020-04-11: 150 mg via INTRAVENOUS

## 2020-04-11 MED ORDER — LIDOCAINE 2% (20 MG/ML) 5 ML SYRINGE
INTRAMUSCULAR | Status: DC | PRN
  Administered 2020-04-11: 30 mg via INTRAVENOUS

## 2020-04-11 MED ORDER — HYDROMORPHONE HCL 1 MG/ML IJ SOLN: 0.2500 mg | INTRAMUSCULAR | Status: DC | PRN

## 2020-04-11 MED ORDER — ONDANSETRON HCL 4 MG/2ML IJ SOLN
INTRAMUSCULAR | Status: DC | PRN
  Administered 2020-04-11: 4 mg via INTRAVENOUS

## 2020-04-11 MED ORDER — OXYCODONE HCL 5 MG PO TABS: 5.0000 mg | ORAL_TABLET | Freq: Once | ORAL | Status: DC | PRN

## 2020-04-11 MED ORDER — DEXAMETHASONE SODIUM PHOSPHATE 10 MG/ML IJ SOLN
INTRAMUSCULAR | Status: DC | PRN
  Administered 2020-04-11: 4 mg via INTRAVENOUS

## 2020-04-11 MED ORDER — MIDAZOLAM HCL 2 MG/2ML IJ SOLN
2.0000 mg | Freq: Once | INTRAMUSCULAR | Status: AC
  Administered 2020-04-11: 1 mg via INTRAVENOUS

## 2020-04-11 MED ORDER — CEFAZOLIN SODIUM-DEXTROSE 2-4 GM/100ML-% IV SOLN
INTRAVENOUS | Status: AC
  Filled 2020-04-11: qty 100

## 2020-04-11 MED ORDER — FENTANYL CITRATE (PF) 100 MCG/2ML IJ SOLN
INTRAMUSCULAR | Status: AC
  Filled 2020-04-11: qty 2

## 2020-04-11 SURGICAL SUPPLY — 70 items
BANDAGE ESMARK 6X9 LF (GAUZE/BANDAGES/DRESSINGS) IMPLANT
BLADE HEX COATED 2.75 (ELECTRODE) ×2 IMPLANT
BLADE SURG 15 STRL LF DISP TIS (BLADE) ×2 IMPLANT
BLADE SURG 15 STRL SS (BLADE) ×2
BNDG COHESIVE 4X5 TAN STRL (GAUZE/BANDAGES/DRESSINGS) ×2 IMPLANT
BNDG ELASTIC 4X5.8 VLCR STR LF (GAUZE/BANDAGES/DRESSINGS) ×2 IMPLANT
BNDG ESMARK 6X9 LF (GAUZE/BANDAGES/DRESSINGS)
BOOT STEPPER DURA LG (SOFTGOODS) ×2 IMPLANT
CANISTER SUCT 1200ML W/VALVE (MISCELLANEOUS) ×2 IMPLANT
COVER BACK TABLE 60X90IN (DRAPES) ×2 IMPLANT
COVER WAND RF STERILE (DRAPES) IMPLANT
CUFF TOURN SGL QUICK 34 (TOURNIQUET CUFF) ×1
CUFF TRNQT CYL 34X4.125X (TOURNIQUET CUFF) ×1 IMPLANT
DECANTER SPIKE VIAL GLASS SM (MISCELLANEOUS) IMPLANT
DRAPE EXTREMITY T 121X128X90 (DISPOSABLE) ×2 IMPLANT
DRAPE IMP U-DRAPE 54X76 (DRAPES) ×2 IMPLANT
DRAPE OEC MINIVIEW 54X84 (DRAPES) IMPLANT
DRAPE SURG 17X23 STRL (DRAPES) IMPLANT
DRAPE U-SHAPE 47X51 STRL (DRAPES) ×2 IMPLANT
DRESSING MEPILEX FLEX 4X4 (GAUZE/BANDAGES/DRESSINGS) ×1 IMPLANT
DRSG MEPILEX FLEX 4X4 (GAUZE/BANDAGES/DRESSINGS) ×2
DRSG PAD ABDOMINAL 8X10 ST (GAUZE/BANDAGES/DRESSINGS) ×4 IMPLANT
DURAPREP 26ML APPLICATOR (WOUND CARE) ×2 IMPLANT
ELECT REM PT RETURN 9FT ADLT (ELECTROSURGICAL) ×2
ELECTRODE REM PT RTRN 9FT ADLT (ELECTROSURGICAL) ×1 IMPLANT
GAUZE SPONGE 4X4 12PLY STRL (GAUZE/BANDAGES/DRESSINGS) ×2 IMPLANT
GAUZE XEROFORM 1X8 LF (GAUZE/BANDAGES/DRESSINGS) ×2 IMPLANT
GLOVE SURG LTX SZ6.5 (GLOVE) ×2 IMPLANT
GLOVE SURG LTX SZ7 (GLOVE) ×2 IMPLANT
GLOVE SURG NEOP MICRO LF SZ7.5 (GLOVE) ×2 IMPLANT
GLOVE SURG SYN 7.5  E (GLOVE) ×1
GLOVE SURG SYN 7.5 E (GLOVE) ×1 IMPLANT
GLOVE SURG UNDER POLY LF SZ7 (GLOVE) ×2 IMPLANT
GOWN STRL REIN XL XLG (GOWN DISPOSABLE) ×4 IMPLANT
GOWN STRL REUS W/ TWL LRG LVL3 (GOWN DISPOSABLE) ×1 IMPLANT
GOWN STRL REUS W/TWL LRG LVL3 (GOWN DISPOSABLE) ×1
MANIFOLD NEPTUNE II (INSTRUMENTS) ×2 IMPLANT
NEEDLE HYPO 22GX1.5 SAFETY (NEEDLE) IMPLANT
NS IRRIG 1000ML POUR BTL (IV SOLUTION) ×2 IMPLANT
PACK BASIN DAY SURGERY FS (CUSTOM PROCEDURE TRAY) ×2 IMPLANT
PAD CAST 3X4 CTTN HI CHSV (CAST SUPPLIES) IMPLANT
PAD CAST 4YDX4 CTTN HI CHSV (CAST SUPPLIES) IMPLANT
PADDING CAST COTTON 3X4 STRL (CAST SUPPLIES)
PADDING CAST COTTON 4X4 STRL (CAST SUPPLIES)
PADDING CAST SYN 6 (CAST SUPPLIES)
PADDING CAST SYNTHETIC 4 (CAST SUPPLIES)
PADDING CAST SYNTHETIC 4X4 STR (CAST SUPPLIES) IMPLANT
PADDING CAST SYNTHETIC 6X4 NS (CAST SUPPLIES) IMPLANT
PENCIL SMOKE EVACUATOR (MISCELLANEOUS) ×2 IMPLANT
SHEET MEDIUM DRAPE 40X70 STRL (DRAPES) ×2 IMPLANT
SLEEVE SCD COMPRESS KNEE MED (MISCELLANEOUS) ×2 IMPLANT
SPLINT FIBERGLASS 3X35 (CAST SUPPLIES) IMPLANT
SPLINT FIBERGLASS 4X30 (CAST SUPPLIES) IMPLANT
SPONGE LAP 18X18 RF (DISPOSABLE) ×2 IMPLANT
STOCKINETTE IMPERVIOUS LG (DRAPES) ×2 IMPLANT
SUCTION FRAZIER HANDLE 10FR (MISCELLANEOUS) ×1
SUCTION TUBE FRAZIER 10FR DISP (MISCELLANEOUS) ×1 IMPLANT
SUT ETHILON 3 0 PS 1 (SUTURE) ×4 IMPLANT
SUT VIC AB 0 CT1 27 (SUTURE)
SUT VIC AB 0 CT1 27XBRD ANBCTR (SUTURE) IMPLANT
SUT VIC AB 2-0 CT1 27 (SUTURE) ×1
SUT VIC AB 2-0 CT1 TAPERPNT 27 (SUTURE) ×1 IMPLANT
SUT VIC AB 2-0 SH 27 (SUTURE) ×1
SUT VIC AB 2-0 SH 27XBRD (SUTURE) ×1 IMPLANT
SYR BULB EAR ULCER 3OZ GRN STR (SYRINGE) ×2 IMPLANT
SYR CONTROL 10ML LL (SYRINGE) IMPLANT
TOWEL GREEN STERILE FF (TOWEL DISPOSABLE) ×2 IMPLANT
TUBE CONNECTING 20X1/4 (TUBING) ×2 IMPLANT
UNDERPAD 30X36 HEAVY ABSORB (UNDERPADS AND DIAPERS) ×4 IMPLANT
YANKAUER SUCT BULB TIP NO VENT (SUCTIONS) ×2 IMPLANT

## 2020-04-11 NOTE — Transfer of Care (Signed)
Immediate Anesthesia Transfer of Care Note  Patient: Celeste Candelas  Procedure(s) Performed: RIGHT PERONEAL TENDON DEBRIDEMENT AND REPAIR (Right Ankle)  Patient Location: PACU  Anesthesia Type:GA combined with regional for post-op pain  Level of Consciousness: drowsy and patient cooperative  Airway & Oxygen Therapy: Patient Spontanous Breathing and Patient connected to face mask oxygen  Post-op Assessment: Report given to RN and Post -op Vital signs reviewed and stable  Post vital signs: Reviewed and stable  Last Vitals:  Vitals Value Taken Time  BP    Temp    Pulse 66 04/11/20 1235  Resp 13 04/11/20 1235  SpO2 99 % 04/11/20 1235  Vitals shown include unvalidated device data.  Last Pain:  Vitals:   04/11/20 0956  TempSrc: Oral  PainSc: 2       Patients Stated Pain Goal: 8 (04/11/20 0956)  Complications: No complications documented.

## 2020-04-11 NOTE — Progress Notes (Signed)
Assisted Dr. Miller with right, ultrasound guided, popliteal block. Side rails up, monitors on throughout procedure. See vital signs in flow sheet. Tolerated Procedure well. 

## 2020-04-11 NOTE — Anesthesia Procedure Notes (Signed)
Procedure Name: LMA Insertion Date/Time: 04/11/2020 11:43 AM Performed by: Sheryn Bison, CRNA Pre-anesthesia Checklist: Patient identified, Emergency Drugs available, Suction available and Patient being monitored Patient Re-evaluated:Patient Re-evaluated prior to induction Oxygen Delivery Method: Circle System Utilized Preoxygenation: Pre-oxygenation with 100% oxygen Induction Type: IV induction Ventilation: Mask ventilation without difficulty LMA: LMA inserted LMA Size: 4.0 Number of attempts: 1 Airway Equipment and Method: bite block Placement Confirmation: positive ETCO2 Tube secured with: Tape Dental Injury: Teeth and Oropharynx as per pre-operative assessment

## 2020-04-11 NOTE — Discharge Instructions (Signed)
Regional Anesthesia Blocks  1. Numbness or the inability to move the "blocked" extremity may last from 3-48 hours after placement. The length of time depends on the medication injected and your individual response to the medication. If the numbness is not going away after 48 hours, call your surgeon.  2. The extremity that is blocked will need to be protected until the numbness is gone and the  Strength has returned. Because you cannot feel it, you will need to take extra care to avoid injury. Because it may be weak, you may have difficulty moving it or using it. You may not know what position it is in without looking at it while the block is in effect.  3. For blocks in the legs and feet, returning to weight bearing and walking needs to be done carefully. You will need to wait until the numbness is entirely gone and the strength has returned. You should be able to move your leg and foot normally before you try and bear weight or walk. You will need someone to be with you when you first try to ensure you do not fall and possibly risk injury.  4. Bruising and tenderness at the needle site are common side effects and will resolve in a few days.  5. Persistent numbness or new problems with movement should be communicated to the surgeon or the [facility] [phone] [facility] [phone]).     Post Anesthesia Home Care Instructions  Activity: Get plenty of rest for the remainder of the day. A responsible individual must stay with you for 24 hours following the procedure.  For the next 24 hours, DO NOT: -Drive a car -Advertising copywriter -Drink alcoholic beverages -Take any medication unless instructed by your physician -Make any legal decisions or sign important papers.  Meals: Start with liquid foods such as gelatin or soup. Progress to regular foods as tolerated. Avoid greasy, spicy, heavy foods. If nausea and/or vomiting occur, drink only clear liquids  until the nausea and/or vomiting subsides. Call your physician if vomiting continues.  Special Instructions/Symptoms: Your throat may feel dry or sore from the anesthesia or the breathing tube placed in your throat during surgery. If this causes discomfort, gargle with warm salt water. The discomfort should disappear within 24 hours.  If you had a scopolamine patch placed behind your ear for the management of post- operative nausea and/or vomiting:  1. The medication in the patch is effective for 72 hours, after which it should be removed.  Wrap patch in a tissue and discard in the trash. Wash hands thoroughly with soap and water. 2. You may remove the patch earlier than 72 hours if you experience unpleasant side effects which may include dry mouth, dizziness or visual disturbances. 3. Avoid touching the patch. Wash your hands with soap and water after contact with the patch.             Postoperative instructions:  Weightbearing instructions: weight bearing as tolerated in the CAM walking boot  Keep your dressing and/or splint clean and dry at all times.  You can remove your dressing on post-operative day #3 and change with a dry/sterile dressing or Band-Aids as needed thereafter.    Incision instructions:  Do not soak your incision for 3 weeks after surgery.  If the incision gets wet, pat dry and do not scrub the incision.  Pain control:  You have been given a prescription to be taken as directed for post-operative pain  control.  In addition, elevate the operative extremity above the heart at all times to prevent swelling and throbbing pain.  Take over-the-counter Colace, 100mg  by mouth twice a day while taking narcotic pain medications to help prevent constipation.  Follow up appointments: 1) 14 days for suture removal and wound check. 2) Dr. as scheduled.   -------------------------------------------------------------------------------------------------------------  After  Surgery Pain Control:  After your surgery, post-surgical discomfort or pain is likely. This discomfort can last several days to a few weeks. At certain times of the day your discomfort may be more intense.  Did you receive a nerve block?  A nerve block can provide pain relief for one hour to two days after your surgery. As long as the nerve block is working, you will experience little or no sensation in the area the surgeon operated on.  As the nerve block wears off, you will begin to experience pain or discomfort. It is very important that you begin taking your prescribed pain medication before the nerve block fully wears off. Treating your pain at the first sign of the block wearing off will ensure your pain is better controlled and more tolerable when full-sensation returns. Do not wait until the pain is intolerable, as the medicine will be less effective. It is better to treat pain in advance than to try and catch up.  General Anesthesia:  If you did not receive a nerve block during your surgery, you will need to start taking your pain medication shortly after your surgery and should continue to do so as prescribed by your surgeon.  Pain Medication:  Most commonly we prescribe Vicodin and Percocet for post-operative pain. Both of these medications contain a combination of acetaminophen (Tylenol) and a narcotic to help control pain.   It takes between 30 and 45 minutes before pain medication starts to work. It is important to take your medication before your pain level gets too intense.   Nausea is a common side effect of many pain medications. You will want to eat something before taking your pain medicine to help prevent nausea.   If you are taking a prescription pain medication that contains acetaminophen, we recommend that you do not take additional over the counter acetaminophen (Tylenol).  Other pain relieving options:   Using a cold pack to ice the affected area a few times a day (15 to  20 minutes at a time) can help to relieve pain, reduce swelling and bruising.   Elevation of the affected area can also help to reduce pain and swelling.

## 2020-04-11 NOTE — H&P (Signed)
    PREOPERATIVE H&P  Chief Complaint: right peroneal tendinopathy  HPI: Jesus Edwards is a 75 y.o. male who presents for surgical treatment of right peroneal tendinopathy.  He denies any changes in medical history.  Past Medical History:  Diagnosis Date  . Coronary artery disease   . Hyperlipemia    Past Surgical History:  Procedure Laterality Date  . CARDIAC CATHETERIZATION     1997 stent placed   Social History   Socioeconomic History  . Marital status: Married    Spouse name: Not on file  . Number of children: Not on file  . Years of education: Not on file  . Highest education level: Not on file  Occupational History  . Not on file  Tobacco Use  . Smoking status: Never Smoker  . Smokeless tobacco: Never Used  Vaping Use  . Vaping Use: Never used  Substance and Sexual Activity  . Alcohol use: Not Currently    Comment: rare  . Drug use: Not on file  . Sexual activity: Not on file  Other Topics Concern  . Not on file  Social History Narrative  . Not on file   Social Determinants of Health   Financial Resource Strain: Not on file  Food Insecurity: Not on file  Transportation Needs: Not on file  Physical Activity: Not on file  Stress: Not on file  Social Connections: Not on file   History reviewed. No pertinent family history. Allergies  Allergen Reactions  . Penicillins     itchy   Prior to Admission medications   Medication Sig Start Date End Date Taking? Authorizing Provider  aspirin 81 MG chewable tablet Chew by mouth daily.   Yes [provider]  atenolol (TENORMIN) 50 MG tablet Take 50 mg by mouth daily.   Yes [provider]  atorvastatin (LIPITOR) 80 MG tablet Take 80 mg by mouth daily.   Yes [provider]     Positive ROS: All other systems have been reviewed and were otherwise negative with the exception of those mentioned in the HPI and as above.  Physical Exam: General: Alert, no acute  distress Cardiovascular: No pedal edema Respiratory: No cyanosis, no use of accessory musculature GI: abdomen soft Skin: No lesions in the area of chief complaint Neurologic: Sensation intact distally Psychiatric: Patient is competent for consent with normal mood and affect Lymphatic: no lymphedema  MUSCULOSKELETAL: exam stable  Assessment: right peroneal tendinopathy  Plan: Plan for Procedure(s): RIGHT PERONEAL TENDON DEBRIDEMENT, POSSIBLE REPAIR  The risks benefits and alternatives were discussed with the patient including but not limited to the risks of nonoperative treatment, versus surgical intervention including infection, bleeding, nerve injury,  blood clots, cardiopulmonary complications, morbidity, mortality, among others, and they were willing to proceed.   Preoperative templating of the joint replacement has been completed, documented, and submitted to the Operating Room personnel in order to optimize intra-operative equipment management.   Glee Arvin, MD 04/11/2020 11:09 AM

## 2020-04-11 NOTE — Anesthesia Postprocedure Evaluation (Signed)
Anesthesia Post Note  Patient: Jesus Edwards  Procedure(s) Performed: RIGHT PERONEAL TENDON DEBRIDEMENT AND REPAIR (Right Ankle)     Patient location during evaluation: PACU Anesthesia Type: General Level of consciousness: awake and alert Pain management: pain level controlled Vital Signs Assessment: post-procedure vital signs reviewed and stable Respiratory status: spontaneous breathing, nonlabored ventilation and respiratory function stable Cardiovascular status: blood pressure returned to baseline and stable Postop Assessment: no apparent nausea or vomiting Anesthetic complications: no   No complications documented.  Last Vitals:  Vitals:   04/11/20 1300 04/11/20 1310  BP: 132/79 135/71  Pulse: 65 61  Resp: 14 16  Temp:  36.7 C  SpO2: 99% 97%    Last Pain:  Vitals:   04/11/20 1310  TempSrc:   PainSc: 0-No pain                 Lowella Curb

## 2020-04-11 NOTE — Op Note (Signed)
   Date of Surgery: 04/11/2020  INDICATIONS: Jesus Edwards is a 75 y.o.-year-old male with a right ankle pain due to peroneal tendinosis that failed conservative treatment.  The patient did consent to the procedure after discussion of the risks and benefits.  PREOPERATIVE DIAGNOSIS: Tendinosis and interstitial tear of the peroneus longus right ankle  POSTOPERATIVE DIAGNOSIS: Same.  PROCEDURE:  1.  Tenolysis of right peroneus longus tendon 2.  Repair of right peroneus longus tendon 3.  Tenolysis of right peroneus brevis tendon  SURGEON: N. Glee Arvin, M.D.  ASSIST: Starlyn Skeans [place], [place]; necessary for the timely completion of procedure and due to complexity of procedure.  ANESTHESIA:  general, popliteal block  IV FLUIDS AND URINE: See anesthesia.  ESTIMATED BLOOD LOSS: Minimal mL.  IMPLANTS: None  DRAINS: None  COMPLICATIONS: see description of procedure.  DESCRIPTION OF PROCEDURE: The patient was brought to the operating room.  The patient had been signed prior to the procedure and this was documented. The patient had the anesthesia placed by the anesthesiologist.  A time-out was performed to confirm that this was the correct patient, site, side and location. The patient did receive antibiotics prior to the incision and was re-dosed during the procedure as needed at indicated intervals.  A tourniquet was placed.  The patient had the operative extremity prepped and draped in the standard surgical fashion.    An incision was made over the peroneus longus tendon distal to the lateral malleolus.  Dissection was carried down through the subcutaneous tissue.  Cutaneous nerve branches were mobilized and retracted.  The tendon sheath of the peroneus longus was then incised which then delivered the tendon.  The retinaculum of the peroneal tubercle was incised as well.  We then traced the peroneus longus tendon distally to the section of pathology that corresponded to the MRI.  There was  tenosynovitis that I encountered.  Tenolysis was performed using tenotomy scissors.  I did find out interstitial tears along the tendon which was repaired with 2-0 Vicryl.  I then inspected the peroneus brevis tendon which was intact.  There was a mild amount of tenosynovitis.  Tenolysis was performed for this as well.  The tourniquet was then deflated and hemostasis was obtained.  Surgical wound was thoroughly irrigated and closed in layered fashion.  Sterile dressings were applied.  Patient tolerated procedure well had no any complications.  [name] was necessary for opening, closing, retracting, limb positioning and overall facilitation and timely completion of the procedure.  POSTOPERATIVE PLAN: Patient will be discharged home.  He is weight-bear as tolerated in a cam boot.  Ice and elevate as often as possible.  Follow-up in 2 weeks for suture removal.  [name], MD 12:26 PM

## 2020-04-11 NOTE — Anesthesia Preprocedure Evaluation (Signed)
Anesthesia Evaluation  Patient identified by MRN, date of birth, ID band Patient awake    Reviewed: Allergy & Precautions, H&P , NPO status , Patient's Chart, lab work & pertinent test results  Airway Mallampati: II  TM Distance: >3 FB Neck ROM: Full    Dental no notable dental hx.    Pulmonary neg pulmonary ROS,    Pulmonary exam normal breath sounds clear to auscultation       Cardiovascular + CAD  Normal cardiovascular exam Rhythm:Regular Rate:Normal     Neuro/Psych negative neurological ROS  negative psych ROS   GI/Hepatic negative GI ROS, Neg liver ROS,   Endo/Other  negative endocrine ROS  Renal/GU negative Renal ROS  negative genitourinary   Musculoskeletal  (+) Arthritis , Osteoarthritis,    Abdominal (+) + obese,   Peds negative pediatric ROS (+)  Hematology negative hematology ROS (+)   Anesthesia Other Findings   Reproductive/Obstetrics negative OB ROS                             Anesthesia Physical Anesthesia Plan  ASA: III  Anesthesia Plan: General   Post-op Pain Management:  Regional for Post-op pain   Induction: Intravenous  PONV Risk Score and Plan: 2 and Ondansetron, Midazolam and Treatment may vary due to age or medical condition  Airway Management Planned: LMA  Additional Equipment:   Intra-op Plan:   Post-operative Plan: Extubation in OR  Informed Consent: I have reviewed the patients History and Physical, chart, labs and discussed the procedure including the risks, benefits and alternatives for the proposed anesthesia with the patient or authorized representative who has indicated his/her understanding and acceptance.     Dental advisory given  Plan Discussed with: CRNA  Anesthesia Plan Comments:         Anesthesia Quick Evaluation

## 2020-04-11 NOTE — Anesthesia Procedure Notes (Addendum)
Anesthesia Regional Block: Popliteal block   Pre-Anesthetic Checklist: ,, timeout performed, Correct Patient, Correct Site, Correct Laterality, Correct Procedure, Correct Position, site marked, Risks and benefits discussed,  Surgical consent,  Pre-op evaluation,  At surgeon's request and post-op pain management  Laterality: Right  Prep: chloraprep       Needles:  Injection technique: Single-shot  Needle Type: Stimiplex     Needle Length: 9cm  Needle Gauge: 21     Additional Needles:   Procedures:,,,, ultrasound used (permanent image in chart),,,,  Narrative:  Start time: 04/11/2020 10:19 AM End time: 04/11/2020 10:24 AM Injection made incrementally with aspirations every 5 mL.  Performed by: Personally  Anesthesiologist: Lowella Curb, MD

## 2020-04-14 ENCOUNTER — Encounter (HOSPITAL_BASED_OUTPATIENT_CLINIC_OR_DEPARTMENT_OTHER): Payer: Self-pay | Admitting: Orthopaedic Surgery

## 2020-04-25 ENCOUNTER — Ambulatory Visit (INDEPENDENT_AMBULATORY_CARE_PROVIDER_SITE_OTHER): Payer: Medicare HMO | Admitting: Physician Assistant

## 2020-04-25 ENCOUNTER — Encounter: Payer: Self-pay | Admitting: Orthopaedic Surgery

## 2020-04-25 VITALS — Ht 68.0 in | Wt 212.0 lb

## 2020-04-25 DIAGNOSIS — S86311A Strain of muscle(s) and tendon(s) of peroneal muscle group at lower leg level, right leg, initial encounter: Secondary | ICD-10-CM

## 2020-04-25 NOTE — Progress Notes (Signed)
   Post-Op Visit Note   Patient: Jesus Edwards           Date of Birth: 1945-03-23           MRN: [id number] Visit Date: 04/25/2020 PCP: Phebe Colla, MD   Assessment & Plan:  Chief Complaint:  Chief Complaint  Patient presents with  . Right Ankle - Follow-up    Right ankle peroneal tendon debridement 04/11/2020   Visit Diagnoses:  1. Peroneal tendon tear, right, initial encounter     Plan: Patient is a pleasant 75 year old gentleman who comes in today 2 weeks out right peroneal tendon debridement and repair.  He has been doing well.  He has been compliant weightbearing in his cam walker.  Minimal pain.  Examination of his right foot reveals well-healing surgical incision with nylon sutures in place.  No evidence of infection or cellulitis.  He has mild swelling to the foot and ankle.  Calf soft nontender.  Today, sutures were removed and Steri-Strips applied.  He will continue to weight-bear in the cam walker for another 4 weeks until he is 6 weeks out from surgery.  We will go ahead and start him in physical therapy to work on range of motion exercises.  He lives near [place] and notes there is a physical therapy location near his house for which she would like to attend.  I have provided him with a hard copy for PT to bring to them.  He will follow up with [place] in 4 weeks time for recheck.  Call with concerns or questions.  Follow-Up Instructions: Return in about 4 weeks (around 05/23/2020).   Orders:  No orders of the defined types were placed in this encounter.  No orders of the defined types were placed in this encounter.   Imaging: No new imaging  PMFS History: Patient Active Problem List   Diagnosis Date Noted  . Peroneal tendon tear, right, initial encounter 03/06/2020  . Pain in right ankle and joints of right foot 02/12/2020  . Primary osteoarthritis of right knee 02/12/2020   Past Medical History:  Diagnosis Date  . Coronary artery disease   . Hyperlipemia      History reviewed. No pertinent family history.  Past Surgical History:  Procedure Laterality Date  . CARDIAC CATHETERIZATION     1997 stent placed  . POSTERIOR TIBIAL TENDON REPAIR Right 04/11/2020   Procedure: RIGHT PERONEAL TENDON DEBRIDEMENT AND REPAIR;  Surgeon: Tarry Kos, MD;  Location: [facility];  Service: Orthopedics;  Laterality: Right;   Social History   Occupational History  . Not on file  Tobacco Use  . Smoking status: Never Smoker  . Smokeless tobacco: Never Used  Vaping Use  . Vaping Use: Never used  Substance and Sexual Activity  . Alcohol use: Not Currently    Comment: rare  . Drug use: Not on file  . Sexual activity: Not on file

## 2020-04-28 ENCOUNTER — Telehealth: Payer: Self-pay | Admitting: Orthopaedic Surgery

## 2020-04-28 NOTE — Telephone Encounter (Signed)
Brinkly from [facility] calling for a referral for physical therapy and medical notes. Brinkly asking to be call back at [phone].

## 2020-04-29 ENCOUNTER — Other Ambulatory Visit: Payer: Self-pay

## 2020-04-29 DIAGNOSIS — S86311A Strain of muscle(s) and tendon(s) of peroneal muscle group at lower leg level, right leg, initial encounter: Secondary | ICD-10-CM

## 2020-04-29 NOTE — Telephone Encounter (Signed)
FAXED

## 2020-05-02 ENCOUNTER — Telehealth: Payer: Self-pay | Admitting: Orthopaedic Surgery

## 2020-05-23 ENCOUNTER — Ambulatory Visit (INDEPENDENT_AMBULATORY_CARE_PROVIDER_SITE_OTHER): Payer: Medicare HMO | Admitting: Orthopaedic Surgery

## 2020-05-23 ENCOUNTER — Encounter: Payer: Self-pay | Admitting: Orthopaedic Surgery

## 2020-05-23 ENCOUNTER — Other Ambulatory Visit: Payer: Self-pay

## 2020-05-23 DIAGNOSIS — S86311A Strain of muscle(s) and tendon(s) of peroneal muscle group at lower leg level, right leg, initial encounter: Secondary | ICD-10-CM

## 2020-05-23 NOTE — Progress Notes (Signed)
   Post-Op Visit Note   Patient: Jesus Edwards           Date of Birth: [birth year]           MRN: [id number] Visit Date: 05/23/2020 PCP: Phebe Colla, MD   Assessment & Plan:  Chief Complaint:  Chief Complaint  Patient presents with  . Right Ankle - Pain   Visit Diagnoses:  1. Peroneal tendon tear, right, initial encounter     Plan:   [name] is 4 weeks status post right peroneal tendon debridement and repair.  He has been doing well overall has completed physical therapy.  He did physical therapy over at [facility].  He has been discharged from PT due to meeting all of his goals.  He denies any discomfort.  He is overall doing well and happy with his progress.  Right foot shows a fully healed surgical scar.  He has no tenderness or signs of infection.  Ankle range of motion and subtalar motion are acceptable painless.  At this point we will discontinue the cam boot and transition to an ASO brace.  He will continue with home exercises daily.  I reviewed physical therapy notes from [facility] and he has made very good progress.  We will see him back in a month for recheck.  If he is doing well we will probably release him.  Follow-Up Instructions: Return in about 4 weeks (around 06/20/2020).   Orders:  No orders of the defined types were placed in this encounter.  No orders of the defined types were placed in this encounter.   Imaging: No results found.  PMFS History: Patient Active Problem List   Diagnosis Date Noted  . Peroneal tendon tear, right, initial encounter 03/06/2020  . Pain in right ankle and joints of right foot 02/12/2020  . Primary osteoarthritis of right knee 02/12/2020   Past Medical History:  Diagnosis Date  . Coronary artery disease   . Hyperlipemia     History reviewed. No pertinent family history.  Past Surgical History:  Procedure Laterality Date  . CARDIAC CATHETERIZATION     1997 stent placed  . POSTERIOR TIBIAL TENDON REPAIR Right  04/11/2020   Procedure: RIGHT PERONEAL TENDON DEBRIDEMENT AND REPAIR;  Surgeon: Tarry Kos, MD;  Location: [facility];  Service: Orthopedics;  Laterality: Right;   Social History   Occupational History  . Not on file  Tobacco Use  . Smoking status: Never Smoker  . Smokeless tobacco: Never Used  Vaping Use  . Vaping Use: Never used  Substance and Sexual Activity  . Alcohol use: Not Currently    Comment: rare  . Drug use: Not on file  . Sexual activity: Not on file

## 2020-06-24 ENCOUNTER — Ambulatory Visit (INDEPENDENT_AMBULATORY_CARE_PROVIDER_SITE_OTHER): Payer: Medicare HMO | Admitting: Orthopaedic Surgery

## 2020-06-24 ENCOUNTER — Other Ambulatory Visit: Payer: Self-pay

## 2020-06-24 DIAGNOSIS — S86311A Strain of muscle(s) and tendon(s) of peroneal muscle group at lower leg level, right leg, initial encounter: Secondary | ICD-10-CM

## 2020-06-24 NOTE — Progress Notes (Signed)
   Post-Op Visit Note   Patient: Linzy Darling           Date of Birth: 05-Oct-1945           MRN: [id number] Visit Date: 06/24/2020 PCP: Phebe Colla, MD   Assessment & Plan:  Chief Complaint: No chief complaint on file.  Visit Diagnoses:  1. Peroneal tendon tear, right, initial encounter     Plan:   Mykael is 10 and half weeks status post right peroneal tendon debridement and repair.  He reports no pain.  He finished physical therapy 3 weeks ago.  Overall doing very well.  No complaints.  Right ankle shows fully healed surgical scar.  There is no tenderness along the peroneal tendons.  No subluxation.  No swelling.  Normal ankle range of motion and strength without pain.  At this point he can start to wean ASO.  I recommend wearing the ASO for when he has to stand longer than 1 hour at a time.  At this point we will see him back as needed.  Follow-Up Instructions: Return if symptoms worsen or fail to improve.   Orders:  No orders of the defined types were placed in this encounter.  No orders of the defined types were placed in this encounter.   Imaging: No results found.  PMFS History: Patient Active Problem List   Diagnosis Date Noted  . Peroneal tendon tear, right, initial encounter 03/06/2020  . Pain in right ankle and joints of right foot 02/12/2020  . Primary osteoarthritis of right knee 02/12/2020   Past Medical History:  Diagnosis Date  . Coronary artery disease   . Hyperlipemia     No family history on file.  Past Surgical History:  Procedure Laterality Date  . CARDIAC CATHETERIZATION     1997 stent placed  . POSTERIOR TIBIAL TENDON REPAIR Right 04/11/2020   Procedure: RIGHT PERONEAL TENDON DEBRIDEMENT AND REPAIR;  Surgeon: Tarry Kos, MD;  Location: [facility];  Service: Orthopedics;  Laterality: Right;   Social History   Occupational History  . Not on file  Tobacco Use  . Smoking status: Never Smoker  . Smokeless tobacco:  Never Used  Vaping Use  . Vaping Use: Never used  Substance and Sexual Activity  . Alcohol use: Not Currently    Comment: rare  . Drug use: Not on file  . Sexual activity: Not on file

## 2020-07-15 ENCOUNTER — Other Ambulatory Visit: Payer: Self-pay

## 2020-07-15 ENCOUNTER — Ambulatory Visit: Payer: Medicare HMO | Admitting: Orthopaedic Surgery

## 2020-07-15 ENCOUNTER — Ambulatory Visit (INDEPENDENT_AMBULATORY_CARE_PROVIDER_SITE_OTHER): Payer: Medicare HMO

## 2020-07-15 DIAGNOSIS — M25511 Pain in right shoulder: Secondary | ICD-10-CM | POA: Diagnosis not present

## 2020-07-15 NOTE — Progress Notes (Signed)
Office Visit Note   Patient: Jesus Edwards           Date of Birth: 05-06-1945           MRN: [id number] Visit Date: 07/15/2020              Requested by: Phebe Colla, MD [address] [place],  [place] [address] PCP: Phebe Colla, MD   Assessment & Plan: Visit Diagnoses:  1. Acute pain of right shoulder     Plan: Impression is chronic right shoulder pain and biceps tendinopathy versus partial rotator cuff tear.  Based on treatment options he agreed to try an injection into the bicipital groove as well as some home exercises once he feels improvement.  He should follow-up with he does not receive any improvement or relief.  Follow-Up Instructions: Return if symptoms worsen or fail to improve.   Orders:  Orders Placed This Encounter  Procedures  . XR Shoulder Right   No orders of the defined types were placed in this encounter.     Procedures: No procedures performed   Clinical Data: No additional findings.   Subjective: Chief Complaint  Patient presents with  . Right Shoulder - Pain    Jesus Edwards is a 75 year old gentleman who comes in for evaluation of chronic right shoulder pain for about a month when raising his arm.  Denies any injuries.  He denies any numbness and tingling neck pain or radicular symptoms.  He states that it started after doing yard work.  He feels pain in the front of the shoulder.   Review of Systems  Constitutional: Negative.   All other systems reviewed and are negative.    Objective: Vital Signs: There were no vitals taken for this visit.  Physical Exam Vitals and nursing note reviewed.  Constitutional:      Appearance: He is well-developed.  Pulmonary:     Effort: Pulmonary effort is normal.  Abdominal:     Palpations: Abdomen is soft.  Skin:    General: Skin is warm.  Neurological:     Mental Status: He is alert and oriented to person, place, and time.  Psychiatric:        Behavior: Behavior normal.         Thought Content: Thought content normal.        Judgment: Judgment normal.     Ortho Exam Right shoulder shows positive Speed test and tenderness to the bicipital groove.  Negative belly negative bearhug negative Hawkins.  He has a slight shrug when abducting the shoulder.  Empty can shows 4 out of 5 strength. Specialty Comments:  No specialty comments available.  Imaging: XR Shoulder Right  Result Date: 07/15/2020 Mild osteoarthritis appropriate for age    PMFS History: Patient Active Problem List   Diagnosis Date Noted  . Peroneal tendon tear, right, initial encounter 03/06/2020  . Pain in right ankle and joints of right foot 02/12/2020  . Primary osteoarthritis of right knee 02/12/2020   Past Medical History:  Diagnosis Date  . Coronary artery disease   . Hyperlipemia     No family history on file.  Past Surgical History:  Procedure Laterality Date  . CARDIAC CATHETERIZATION     1997 stent placed  . POSTERIOR TIBIAL TENDON REPAIR Right 04/11/2020   Procedure: RIGHT PERONEAL TENDON DEBRIDEMENT AND REPAIR;  Surgeon: Tarry Kos, MD;  Location: [facility];  Service: Orthopedics;  Laterality: Right;   Social History   Occupational History  .  Not on file  Tobacco Use  . Smoking status: Never Smoker  . Smokeless tobacco: Never Used  Vaping Use  . Vaping Use: Never used  Substance and Sexual Activity  . Alcohol use: Not Currently    Comment: rare  . Drug use: Not on file  . Sexual activity: Not on file

## 2020-07-22 ENCOUNTER — Ambulatory Visit (INDEPENDENT_AMBULATORY_CARE_PROVIDER_SITE_OTHER): Payer: Medicare HMO | Admitting: Family Medicine

## 2020-07-22 ENCOUNTER — Other Ambulatory Visit: Payer: Self-pay

## 2020-07-22 ENCOUNTER — Ambulatory Visit: Payer: Self-pay

## 2020-07-22 DIAGNOSIS — M25511 Pain in right shoulder: Secondary | ICD-10-CM

## 2020-07-22 NOTE — Progress Notes (Signed)
Subjective: He is here for a planned right shoulder long head biceps tendon sheath injection.  Objective: He is tender to palpation along the bicipital groove.  Procedure: Right shoulder injection: After sterile prep with Betadine, injected 3 cc 1% lidocaine without epinephrine and 6 mg betamethasone into the long head biceps tendon sheath with complete resolution of pain during the anesthetic phase.  He will follow-up as needed.

## 2020-09-24 ENCOUNTER — Ambulatory Visit: Payer: Medicare HMO | Admitting: Orthopaedic Surgery

## 2020-11-04 ENCOUNTER — Encounter: Payer: Self-pay | Admitting: Orthopaedic Surgery

## 2020-11-04 ENCOUNTER — Ambulatory Visit: Payer: Medicare HMO | Admitting: Orthopaedic Surgery

## 2020-11-04 ENCOUNTER — Other Ambulatory Visit: Payer: Self-pay

## 2020-11-04 VITALS — Ht 68.0 in | Wt 212.0 lb

## 2020-11-04 DIAGNOSIS — M25511 Pain in right shoulder: Secondary | ICD-10-CM

## 2020-11-04 MED ORDER — BUPIVACAINE HCL 0.5 % IJ SOLN
3.0000 mL | INTRAMUSCULAR | Status: AC | PRN
Start: 2020-11-04 — End: 2020-11-04
  Administered 2020-11-04: 3 mL via INTRA_ARTICULAR

## 2020-11-04 MED ORDER — LIDOCAINE HCL 1 % IJ SOLN
3.0000 mL | INTRAMUSCULAR | Status: AC | PRN
  Administered 2020-11-04: 3 mL

## 2020-11-04 MED ORDER — METHYLPREDNISOLONE ACETATE 40 MG/ML IJ SUSP
40.0000 mg | INTRAMUSCULAR | Status: AC | PRN
  Administered 2020-11-04: 40 mg via INTRA_ARTICULAR

## 2020-11-04 NOTE — Progress Notes (Signed)
   Office Visit Note   Patient: Jesus Edwards           Date of Birth: 11/11/1945           MRN: [id number] Visit Date: 11/04/2020              Requested by: Phebe Colla, MD [address] [place],  [place] [address] PCP: Phebe Colla, MD   Assessment & Plan: Visit Diagnoses:  1. Acute pain of right shoulder     Plan: Impression is right shoulder pain biceps tendinopathy versus subscap tendinopathy.  Prior injection did give really good relief requesting another 1 today.  He tolerated the injection well today.  Had good relief during the anesthetic phase.  Follow-up as needed.  Follow-Up Instructions: No follow-ups on file.   Orders:  No orders of the defined types were placed in this encounter.  No orders of the defined types were placed in this encounter.     Procedures: Large Joint Inj: R glenohumeral on 11/04/2020 12:10 PM Indications: pain Details: 22 G needle  Arthrogram: No  Medications: 3 mL lidocaine 1 %; 3 mL bupivacaine 0.5 %; 40 mg methylPREDNISolone acetate 40 MG/ML Outcome: tolerated well, no immediate complications Consent was given by the patient. Patient was prepped and draped in the usual sterile fashion.      Clinical Data: No additional findings.   Subjective: Chief Complaint  Patient presents with   Right Shoulder - Follow-up, Pain    HPI  Jesus Edwards returns today for recurrent right shoulder pain.  Dr. Prince Rome provided him with a bicipital groove injection on 07/22/2020 which helped tremendously.  He is felt this pain has returned.  Would like another injection.  Pain is 7 out of 10.  He really notices pain when he is driving and resting his arm and elbow on the center console.  Review of Systems   Objective: Vital Signs: Ht 5\' 8"  (1.727 m)   Wt 212 lb (96.2 kg)   BMI 32.23 kg/m   Physical Exam  Ortho Exam  Right shoulder shows full range of motion.  He is quite tender to the bicipital groove.  Mild pain with resisted internal  rotation  Specialty Comments:  No specialty comments available.  Imaging: No results found.   PMFS History: Patient Active Problem List   Diagnosis Date Noted   Peroneal tendon tear, right, initial encounter 03/06/2020   Pain in right ankle and joints of right foot 02/12/2020   Primary osteoarthritis of right knee 02/12/2020   Past Medical History:  Diagnosis Date   Coronary artery disease    Hyperlipemia     No family history on file.  Past Surgical History:  Procedure Laterality Date   CARDIAC CATHETERIZATION     1997 stent placed   POSTERIOR TIBIAL TENDON REPAIR Right 04/11/2020   Procedure: RIGHT PERONEAL TENDON DEBRIDEMENT AND REPAIR;  Surgeon: 04/13/2020, MD;  Location: [facility];  Service: Orthopedics;  Laterality: Right;   Social History   Occupational History   Not on file  Tobacco Use   Smoking status: Never   Smokeless tobacco: Never  Vaping Use   Vaping Use: Never used  Substance and Sexual Activity   Alcohol use: Not Currently    Comment: rare   Drug use: Not on file   Sexual activity: Not on file

## 2021-04-09 ENCOUNTER — Other Ambulatory Visit: Payer: Self-pay

## 2021-04-09 ENCOUNTER — Encounter: Payer: Self-pay | Admitting: Orthopaedic Surgery

## 2021-04-09 ENCOUNTER — Ambulatory Visit: Payer: Medicare HMO | Admitting: Orthopaedic Surgery

## 2021-04-09 DIAGNOSIS — M25511 Pain in right shoulder: Secondary | ICD-10-CM | POA: Diagnosis not present

## 2021-04-09 DIAGNOSIS — G8929 Other chronic pain: Secondary | ICD-10-CM | POA: Diagnosis not present

## 2021-04-09 NOTE — Progress Notes (Signed)
° °  Office Visit Note   Patient: Jesus Edwards           Date of Birth: 1946-01-01           MRN: [id number] Visit Date: 04/09/2021              Requested by: Phebe Colla, MD [address] [place],  [place] [address] PCP: Phebe Colla, MD   Assessment & Plan: Visit Diagnoses:  1. Chronic right shoulder pain     Plan: [name] returns today for chronic right shoulder pain.  He has had 2 injections in the last 6 months with temporary relief.  He still has pain to the right shoulder with movement and raising of the arm.  Takes Tylenol as needed for the pain.  Examination of the right shoulder is unchanged.  Impression is chronic right shoulder pain with temporary relief from 2 prior injections.  At this point we need to obtain MRI to rule out structural abnormality before we continue with cortisone injections.  We will see him back after the MRI.  Follow-Up Instructions: No follow-ups on file.   Orders:  Orders Placed This Encounter  Procedures   MR SHOULDER RIGHT WO CONTRAST   No orders of the defined types were placed in this encounter.     Procedures: No procedures performed   Clinical Data: No additional findings.   Subjective: Chief Complaint  Patient presents with   Right Shoulder - Pain    HPI  Review of Systems   Objective: Vital Signs: There were no vitals taken for this visit.  Physical Exam  Ortho Exam  Specialty Comments:  No specialty comments available.  Imaging: No results found.   PMFS History: Patient Active Problem List   Diagnosis Date Noted   Peroneal tendon tear, right, initial encounter 03/06/2020   Pain in right ankle and joints of right foot 02/12/2020   Primary osteoarthritis of right knee 02/12/2020   Past Medical History:  Diagnosis Date   Coronary artery disease    Hyperlipemia     History reviewed. No pertinent family history.  Past Surgical History:  Procedure Laterality Date   CARDIAC CATHETERIZATION      1997 stent placed   POSTERIOR TIBIAL TENDON REPAIR Right 04/11/2020   Procedure: RIGHT PERONEAL TENDON DEBRIDEMENT AND REPAIR;  Surgeon: Tarry Kos, MD;  Location: [facility];  Service: Orthopedics;  Laterality: Right;   Social History   Occupational History   Not on file  Tobacco Use   Smoking status: Never   Smokeless tobacco: Never  Vaping Use   Vaping Use: Never used  Substance and Sexual Activity   Alcohol use: Not Currently    Comment: rare   Drug use: Not on file   Sexual activity: Not on file

## 2021-04-13 ENCOUNTER — Telehealth: Payer: Self-pay | Admitting: Orthopedic Surgery

## 2021-04-13 NOTE — Telephone Encounter (Signed)
Mr. Pillsbury states that Dr. Roda Shutters ordered an MRI at his visit last Thursday.  He is calling to check on the status of that being scheduled.  He can be reached at [phone].

## 2021-04-13 NOTE — Telephone Encounter (Signed)
Sw pt and informed him order was sent to [facility] and theyw ill be the ones to contact him to schedule appt

## 2021-04-22 ENCOUNTER — Ambulatory Visit
Admission: RE | Admit: 2021-04-22 | Discharge: 2021-04-22 | Disposition: A | Discharge status: Home / Self Care | Payer: Medicare HMO | Source: Ambulatory Visit | Attending: Orthopaedic Surgery | Admitting: Orthopaedic Surgery

## 2021-04-22 ENCOUNTER — Other Ambulatory Visit: Payer: Self-pay

## 2021-04-22 DIAGNOSIS — M25511 Pain in right shoulder: Secondary | ICD-10-CM

## 2021-04-22 DIAGNOSIS — G8929 Other chronic pain: Secondary | ICD-10-CM

## 2021-04-22 IMAGING — MR MR SHOULDER*R* W/O CM
5 series · 35 of 40 positions shown · non-contrast
Comparison: Right shoulder radiographs [DATE]

CLINICAL DATA: Shoulder pain.  Rotator cuff disorder suspected.

EXAM:
MRI OF THE RIGHT SHOULDER WITHOUT CONTRAST
TECHNIQUE: Multiplanar, multisequence MR imaging of the shoulder was performed.
No intravenous contrast was administered.

[Series 3: T2 fat-sat · axial · 4.0mm · 0.55mm/px · z∈[-27,+68]mm · 8 of 22 slices shown (1 of 3)]
[im 1/22]
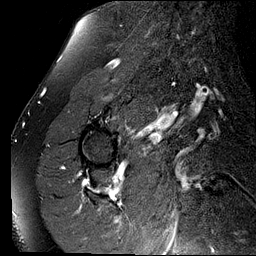
[im 4/22]
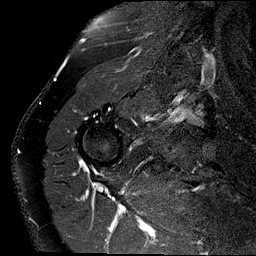
[im 7/22]
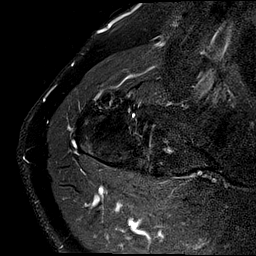
[im 10/22]
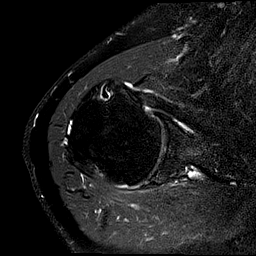
[im 13/22]
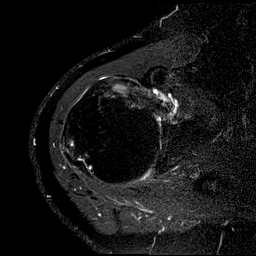
[im 16/22]
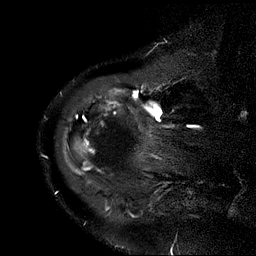
[im 19/22]
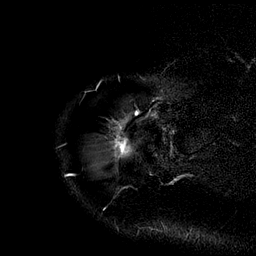
[im 22/22]
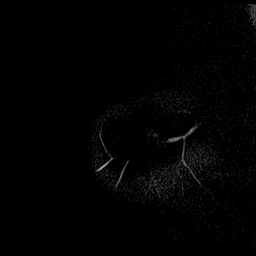

[Series 4: T2 fat-sat · coronal · 4.0mm · 0.59mm/px · 8 of 21 slices shown (2 of 3)]
[im 1/21]
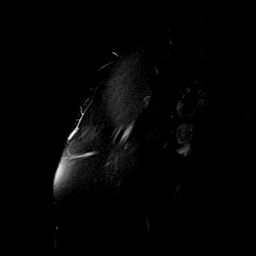
[im 3/21]
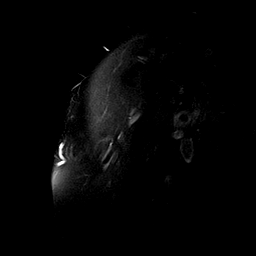
[im 6/21]
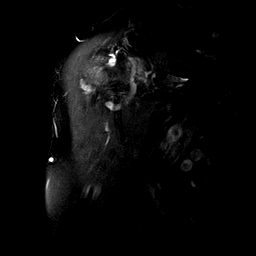
[im 9/21]
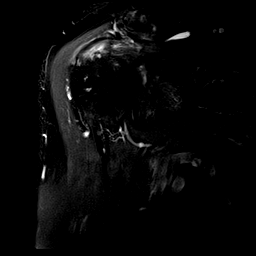
[im 12/21]
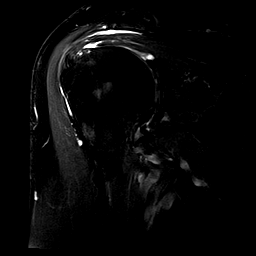
[im 15/21]
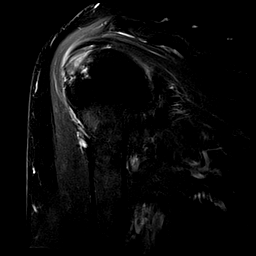
[im 18/21]
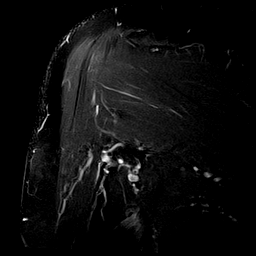
[im 21/21]
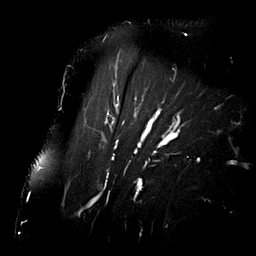

[Series 5: PD · coronal · 4.0mm · 0.29mm/px · 8 of 21 slices shown]
[im 1/21]
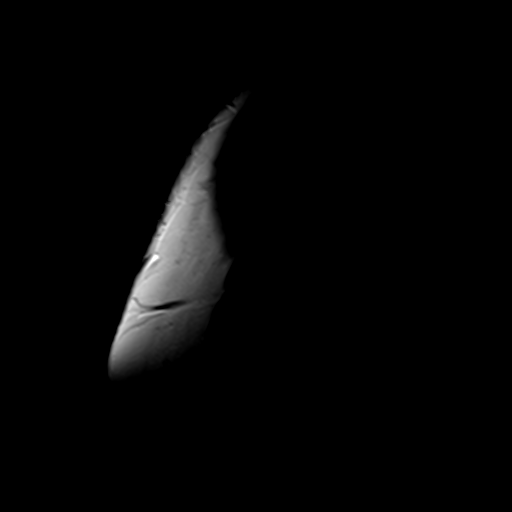
[im 3/21]
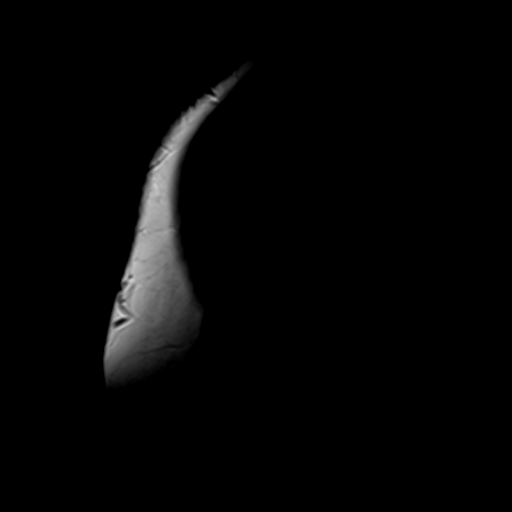
[im 6/21]
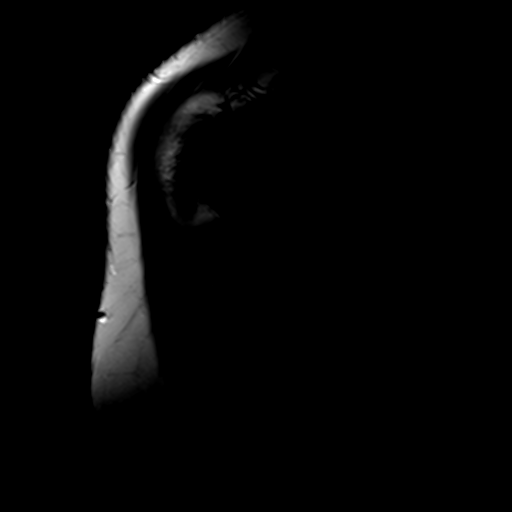
[im 9/21]
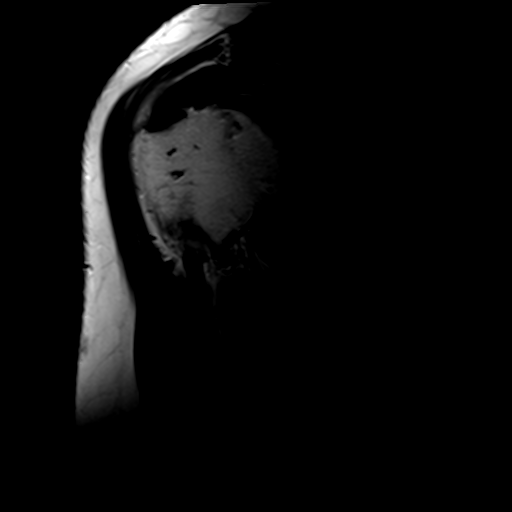
[im 12/21]
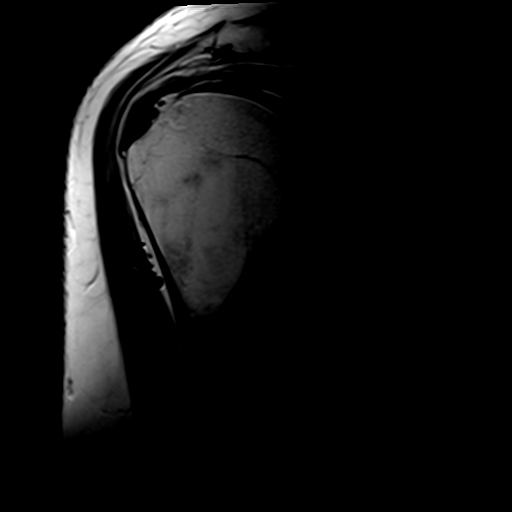
[im 15/21]
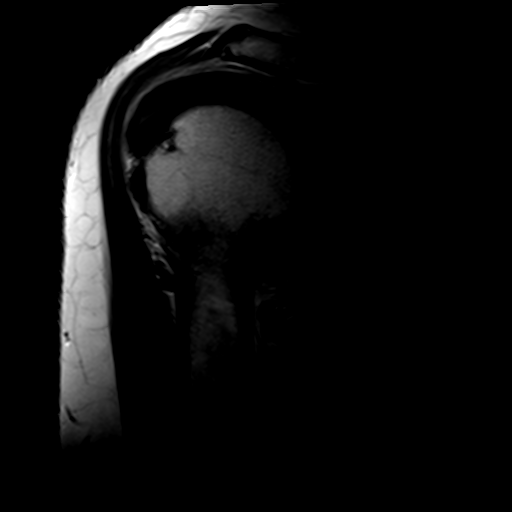
[im 18/21]
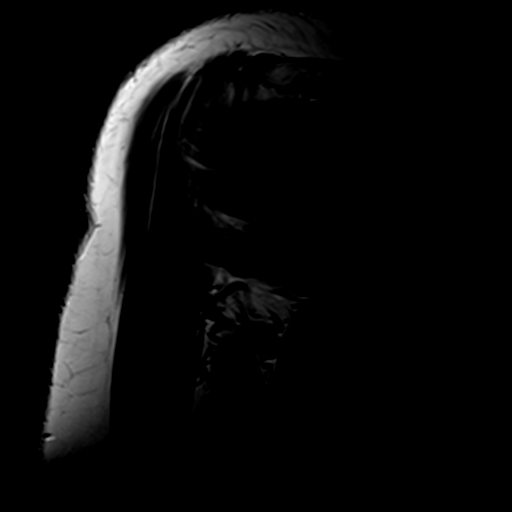
[im 21/21]
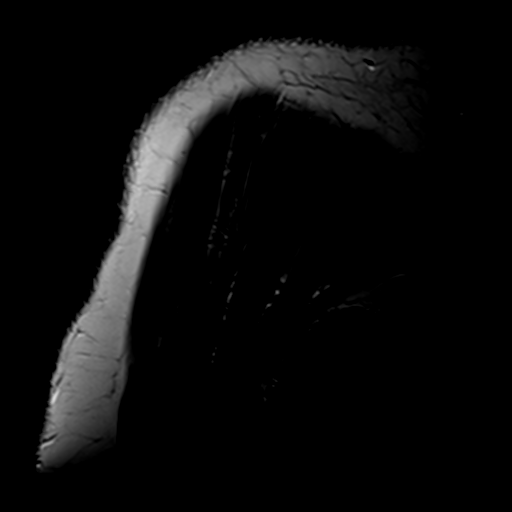

[Series 6: T2 fat-sat · oblique · 4.0mm · 0.59mm/px · 8 of 20 slices shown (3 of 3)]
[im 1/20]
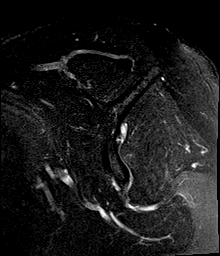
[im 3/20]
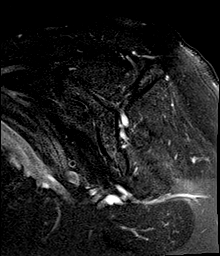
[im 6/20]
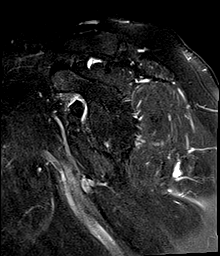
[im 9/20]
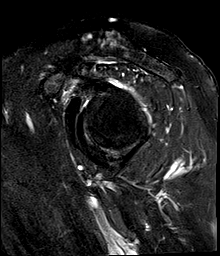
[im 11/20]
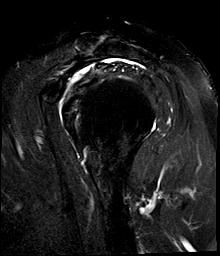
[im 14/20]
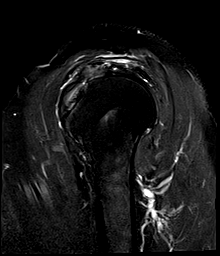
[im 17/20]
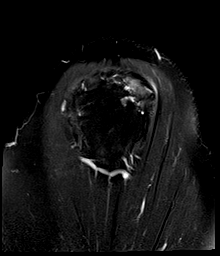
[im 20/20]
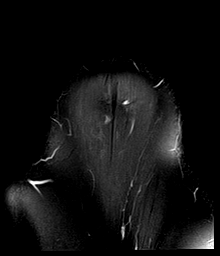

[Series 7: T1 · oblique · 4.0mm · 0.29mm/px · 3 of 20 slices shown]
[im 1/20]
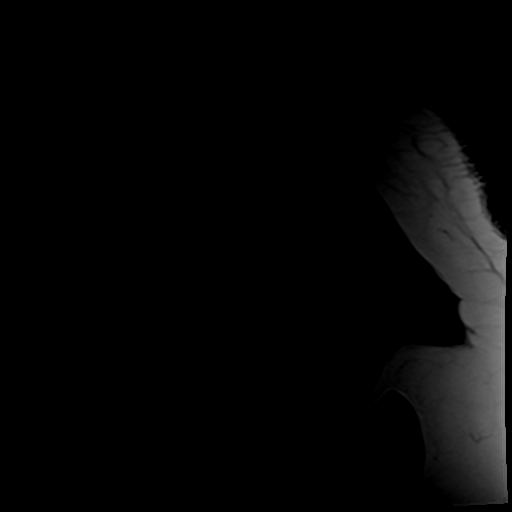
[im 3/20]
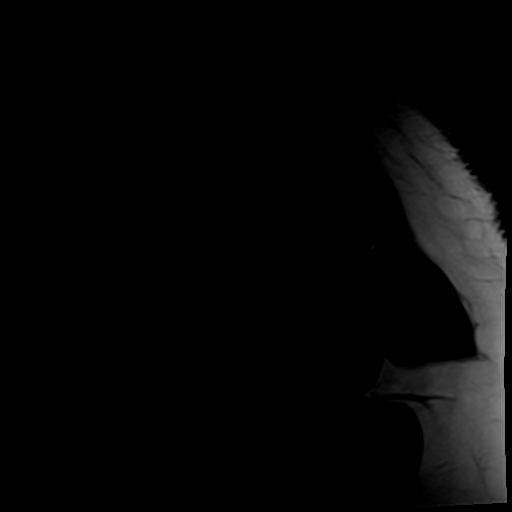
[im 6/20]
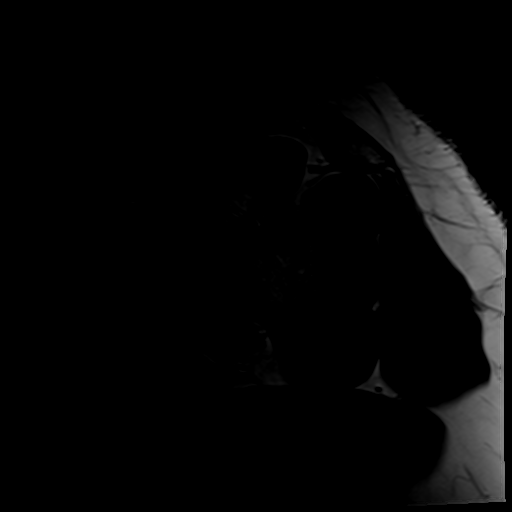

[35 of 40 positions shown; findings below may reference images not displayed]

FINDINGS: Rotator cuff: There is moderate intermediate T2 signal and
thickening tendinosis of the infraspinatus diffusely in the anterior
supraspinatus. There are multiple linear fluid bright tears within
the supraspinatus and infraspinatus. This includes fluid bright
articular sided partial-thickness tearing of the entire AP dimension
of the supraspinatus tendon footprint diffusely involving up to
approximately 60% of the transverse tendon thickness (coronal images
9 through 13). There are multiple thin linear longitudinal fluid
bright fissures within the mid and anterior infraspinatus tendon
footprint. Moderate superior subscapularis intermediate T2 signal
tendinosis with mild midsubstance partial-thickness tearing. The
teres minor is intact.

Muscles:  Mild supraspinatus muscle atrophy.

Biceps long head: The superior subscapularis tendon tear allows the
long head of the biceps tendon to be perched on the anterior
superior aspect of the lesser tuberosity as the biceps tendon enters
the bicipital groove. There is attenuation and partial-thickness
tearing of the anterior biceps tendon in this region (axial image
13).

Acromioclavicular Joint: There are mild-to-moderate degenerative
changes of the acromioclavicular joint including joint space
narrowing, subchondral marrow edema, and peripheral osteophytosis.
Type II acromion. Mild subacromial/subdeltoid bursitis.

Glenohumeral Joint: Moderate to severe glenoid and humeral head
cartilage thinning with multifocal full-thickness cartilage loss.
Mild inferior humeral head-neck junction and moderate inferior
glenoid degenerative osteophytes.

Labrum: Degenerative attenuation and irregularity of the posterior
and posterosuperior glenoid labrum.

Bones:  No acute fracture.

Other: None.
IMPRESSION: :
IMPRESSION: 1. Moderate partial-thickness articular sided tearing of the entire
AP dimension of the supraspinatus tendon footprint.
2. Multiple fluid bright fissures within the mid and anterior
infraspinatus tendon footprint.
3. Moderate superior subscapularis tendinosis and mild midsubstance
partial-thickness tearing.
4. This allows the long head of the biceps tendon to be perched on
the anterior superior aspect of the lesser tuberosity as the biceps
tendon enters the bicipital groove. Partial-thickness tearing of the
anterior biceps tendon in this region.
5. Mild-to-moderate degenerative changes of the acromioclavicular
joint.
6. Moderate glenohumeral osteoarthritis.

## 2021-04-28 ENCOUNTER — Encounter: Payer: Self-pay | Admitting: Orthopaedic Surgery

## 2021-04-28 ENCOUNTER — Other Ambulatory Visit: Payer: Self-pay

## 2021-04-28 ENCOUNTER — Ambulatory Visit: Payer: Medicare HMO | Admitting: Orthopaedic Surgery

## 2021-04-28 DIAGNOSIS — G8929 Other chronic pain: Secondary | ICD-10-CM | POA: Diagnosis not present

## 2021-04-28 DIAGNOSIS — M25511 Pain in right shoulder: Secondary | ICD-10-CM | POA: Diagnosis not present

## 2021-04-28 NOTE — Progress Notes (Signed)
? ?  Office Visit Note ?  ?Patient: Jesus Edwards           ?Date of Birth: 06/20/1945           ?MRN: [id number] ?Visit Date: 04/28/2021 ?             ?Requested by: Phebe Colla, MD ?[address] ?[place],  [place] [address] ?PCP: Phebe Colla, MD ? ? ?Assessment & Plan: ?Visit Diagnoses:  ?1. Chronic right shoulder pain   ? ? ?Plan: Jesus Edwards returns today to discuss right shoulder MRI. ? ?Examination of the right shoulder is unchanged. ? ?MRI shows diffusely degenerative rotator cuff and biceps tendon and AC joint as well as glenohumeral joint.  Treatment options were discussed to include medications, injections, physical therapy, arthroscopic debridement, shoulder arthroplasty and their associated pros and cons.  Based on his options he would like to go with an injection.  We will see him back as needed. ? ?Follow-Up Instructions: No follow-ups on file.  ? ?Orders:  ?Orders Placed This Encounter  ?Procedures  ? Ambulatory referral to Physical Medicine Rehab  ? ?No orders of the defined types were placed in this encounter. ? ? ? ? Procedures: ?No procedures performed ? ? ?Clinical Data: ?No additional findings. ? ? ?Subjective: ?Chief Complaint  ?Patient presents with  ? Right Shoulder - Pain  ? ? ?HPI ? ?Review of Systems ? ? ?Objective: ?Vital Signs: There were no vitals taken for this visit. ? ?Physical Exam ? ?Ortho Exam ? ?Specialty Comments:  ?No specialty comments available. ? ?Imaging: ?No results found. ? ? ?PMFS History: ?Patient Active Problem List  ? Diagnosis Date Noted  ? Peroneal tendon tear, right, initial encounter 03/06/2020  ? Pain in right ankle and joints of right foot 02/12/2020  ? Primary osteoarthritis of right knee 02/12/2020  ? ?Past Medical History:  ?Diagnosis Date  ? Coronary artery disease   ? Hyperlipemia   ?  ?History reviewed. No pertinent family history.  ?Past Surgical History:  ?Procedure Laterality Date  ? CARDIAC CATHETERIZATION    ? 1997 stent placed  ? POSTERIOR TIBIAL  TENDON REPAIR Right 04/11/2020  ? Procedure: RIGHT PERONEAL TENDON DEBRIDEMENT AND REPAIR;  Surgeon: Tarry Kos, MD;  Location: [facility];  Service: Orthopedics;  Laterality: Right;  ? ?Social History  ? ?Occupational History  ? Not on file  ?Tobacco Use  ? Smoking status: Never  ? Smokeless tobacco: Never  ?Vaping Use  ? Vaping Use: Never used  ?Substance and Sexual Activity  ? Alcohol use: Not Currently  ?  Comment: rare  ? Drug use: Not on file  ? Sexual activity: Not on file  ? ? ? ? ? ? ?

## 2021-05-19 ENCOUNTER — Encounter: Payer: Self-pay | Admitting: Physical Medicine and Rehabilitation

## 2021-05-19 ENCOUNTER — Ambulatory Visit: Payer: Medicare HMO | Admitting: Physical Medicine and Rehabilitation

## 2021-05-19 ENCOUNTER — Ambulatory Visit: Payer: Self-pay

## 2021-05-19 ENCOUNTER — Other Ambulatory Visit: Payer: Self-pay

## 2021-05-19 DIAGNOSIS — M25511 Pain in right shoulder: Secondary | ICD-10-CM

## 2021-05-19 DIAGNOSIS — G8929 Other chronic pain: Secondary | ICD-10-CM | POA: Diagnosis not present

## 2021-05-19 NOTE — Progress Notes (Signed)
? ?  [name] - 76 y.o. male MRN [id number]  Date of birth: 1945-12-25 ? ?Office Visit Note: ?Visit Date: 05/19/2021 ?PCP: Lorinda Creed, MD ?Referred by: Lorinda Creed, MD ? ?Subjective: ?Chief Complaint  ?Patient presents with  ? Right Shoulder - Pain  ? ?HPI:  Jesus Edwards is a 76 y.o. male who comes in today at the request of Dr. Eduard Roux for planned Right anesthetic glenohumeral arthrogram with fluoroscopic guidance.  The patient has failed conservative care including home exercise, medications, time and activity modification.  This injection will be diagnostic and hopefully therapeutic.  Please see requesting physician notes for further details and justification.  ? ?ROS Otherwise per HPI. ? ?Assessment & Plan: ?Visit Diagnoses:  ?  ICD-10-CM   ?1. Chronic right shoulder pain  M25.511 Large Joint Inj: R glenohumeral  ? G89.29 XR C-ARM NO REPORT  ?  ?  ?Plan: No additional findings.  ? ?Meds & Orders: No orders of the defined types were placed in this encounter. ?  ?Orders Placed This Encounter  ?Procedures  ? Large Joint Inj: R glenohumeral  ? XR C-ARM NO REPORT  ?  ?Follow-up: Return if symptoms worsen or fail to improve.  ? ?Procedures: ?Large Joint Inj: R glenohumeral on 05/19/2021 1:44 PM ?Indications: pain and diagnostic evaluation ?Details: 22 G 3.5 in needle, fluoroscopy-guided anteromedial approach ? ?Arthrogram: No ? ?Medications: 40 mg triamcinolone acetonide 40 MG/ML; 5 mL bupivacaine 0.25 % ?Outcome: tolerated well, no immediate complications ? ?There was excellent flow of contrast producing a partial arthrogram of the glenohumeral joint. The patient did have relief of symptoms during the anesthetic phase of the injection. ?Procedure, treatment alternatives, risks and benefits explained, specific risks discussed. Consent was given by the patient. Immediately prior to procedure a time out was called to verify the correct patient, procedure, equipment, support staff and site/side marked as  required. Patient was prepped and draped in the usual sterile fashion.  ? ?  ?   ? ?Clinical History: ?No specialty comments available.  ? ? ? ?Objective:  VS:  HT:    WT:   BMI:     BP:   HR: bpm  TEMP: ( )  RESP:  ?Physical Exam  ? ?Imaging: ?XR C-ARM NO REPORT ? ?Result Date: 05/19/2021 ?Please see Notes tab for imaging impression.  ?

## 2021-05-19 NOTE — Progress Notes (Signed)
Pt state right shoulder pain. Pt state any movement with his right arm makes the pain worse. Pt state he takes over the counter pain meds to help ease his pain. ? ?Numeric Pain Rating Scale and Functional Assessment ?Average Pain 3 ? ? ?In the last MONTH (on 0-10 scale) has pain interfered with the following? ? ?1. General activity like being  able to carry out your everyday physical activities such as walking, climbing stairs, carrying groceries, or moving a chair?  ?Rating(7) ? ? ?-BT, -Dye Allergies. ? ?

## 2021-05-20 MED ORDER — BUPIVACAINE HCL 0.25 % IJ SOLN
5.0000 mL | INTRAMUSCULAR | Status: AC | PRN
  Administered 2021-05-19: 5 mL via INTRA_ARTICULAR

## 2021-05-20 MED ORDER — TRIAMCINOLONE ACETONIDE 40 MG/ML IJ SUSP
40.0000 mg | INTRAMUSCULAR | Status: AC | PRN
  Administered 2021-05-19: 40 mg via INTRA_ARTICULAR

## 2021-10-30 ENCOUNTER — Ambulatory Visit: Payer: Medicare HMO | Admitting: Orthopaedic Surgery

## 2021-10-30 ENCOUNTER — Ambulatory Visit (INDEPENDENT_AMBULATORY_CARE_PROVIDER_SITE_OTHER): Payer: Medicare HMO

## 2021-10-30 DIAGNOSIS — M25562 Pain in left knee: Secondary | ICD-10-CM

## 2021-10-30 MED ORDER — METHYLPREDNISOLONE ACETATE 40 MG/ML IJ SUSP
40.0000 mg | INTRAMUSCULAR | Status: AC | PRN
  Administered 2021-10-30: 40 mg via INTRA_ARTICULAR

## 2021-10-30 MED ORDER — BUPIVACAINE HCL 0.5 % IJ SOLN
2.0000 mL | INTRAMUSCULAR | Status: AC | PRN
  Administered 2021-10-30: 2 mL via INTRA_ARTICULAR

## 2021-10-30 MED ORDER — LIDOCAINE HCL 1 % IJ SOLN
2.0000 mL | INTRAMUSCULAR | Status: AC | PRN
  Administered 2021-10-30: 2 mL

## 2021-10-30 NOTE — Progress Notes (Signed)
Office Visit Note   Patient: Jesus Edwards           Date of Birth: 1945/05/01           MRN: [id number] Visit Date: 10/30/2021              Requested by: Phebe Colla, MD [address] [place],  [place] [address] PCP: Phebe Colla, MD   Assessment & Plan: Visit Diagnoses:  1. Acute pain of left knee     Plan: Impression is acute left knee pain.  Medial meniscus tear versus OA exacerbation.  Treatment options reviewed and discussed and based on his options would like to try cortisone injection followed by 6 weeks of rest.  If symptoms persist he will follow-up at that time.  Tolerated the injection well today.  Follow-Up Instructions: No follow-ups on file.   Orders:  Orders Placed This Encounter  Procedures   Large Joint Inj: L knee   XR KNEE 3 VIEW LEFT   No orders of the defined types were placed in this encounter.     Procedures: Large Joint Inj: L knee on 10/30/2021 10:29 AM Details: 22 G needle Medications: 2 mL bupivacaine 0.5 %; 2 mL lidocaine 1 %; 40 mg methylPREDNISolone acetate 40 MG/ML Outcome: tolerated well, no immediate complications Patient was prepped and draped in the usual sterile fashion.       Clinical Data: No additional findings.   Subjective: Chief Complaint  Patient presents with   Left Knee - Pain    HPI Jesus Edwards is a very pleasant 76 year old gentleman here for evaluation of left knee pain for months.  Denies any injuries.  Feels like he may have overdone it prior to the pain.  Has medial pain.  Denies any loss of range of motion or swelling.  Review of Systems  Constitutional: Negative.   All other systems reviewed and are negative.    Objective: Vital Signs: There were no vitals taken for this visit.  Physical Exam Vitals and nursing note reviewed.  Constitutional:      Appearance: He is well-developed.  HENT:     Head: Normocephalic and atraumatic.  Eyes:     Pupils: Pupils are equal, round, and reactive to  light.  Pulmonary:     Effort: Pulmonary effort is normal.  Abdominal:     Palpations: Abdomen is soft.  Musculoskeletal:        General: Normal range of motion.     Cervical back: Neck supple.  Skin:    General: Skin is warm.  Neurological:     Mental Status: He is alert and oriented to person, place, and time.  Psychiatric:        Behavior: Behavior normal.        Thought Content: Thought content normal.        Judgment: Judgment normal.     Ortho Exam Examination of the left knee shows trace effusion.  Medial joint line tenderness and positive McMurray.  Collaterals and cruciates are stable.  Range of motion is preserved. Specialty Comments:  No specialty comments available.  Imaging: XR KNEE 3 VIEW LEFT  Result Date: 10/30/2021 Moderate to severe tricompartmental degenerative joint disease.  Bone-on-bone joint space narrowing of the patellofemoral compartment.    PMFS History: Patient Active Problem List   Diagnosis Date Noted   Peroneal tendon tear, right, initial encounter 03/06/2020   Pain in right ankle and joints of right foot 02/12/2020   Primary osteoarthritis of right knee 02/12/2020  Past Medical History:  Diagnosis Date   Coronary artery disease    Hyperlipemia     No family history on file.  Past Surgical History:  Procedure Laterality Date   CARDIAC CATHETERIZATION     1997 stent placed   POSTERIOR TIBIAL TENDON REPAIR Right 04/11/2020   Procedure: RIGHT PERONEAL TENDON DEBRIDEMENT AND REPAIR;  Surgeon: Tarry Kos, MD;  Location: [facility];  Service: Orthopedics;  Laterality: Right;   Social History   Occupational History   Not on file  Tobacco Use   Smoking status: Never   Smokeless tobacco: Never  Vaping Use   Vaping Use: Never used  Substance and Sexual Activity   Alcohol use: Not Currently    Comment: rare   Drug use: Not on file   Sexual activity: Not on file

## 2022-04-28 ENCOUNTER — Ambulatory Visit: Payer: Medicare HMO | Admitting: Physical Medicine and Rehabilitation

## 2022-04-28 ENCOUNTER — Encounter: Payer: Self-pay | Admitting: Physical Medicine and Rehabilitation

## 2022-04-28 DIAGNOSIS — S39012A Strain of muscle, fascia and tendon of lower back, initial encounter: Secondary | ICD-10-CM | POA: Diagnosis not present

## 2022-04-28 DIAGNOSIS — M7918 Myalgia, other site: Secondary | ICD-10-CM

## 2022-04-28 DIAGNOSIS — M5441 Lumbago with sciatica, right side: Secondary | ICD-10-CM | POA: Diagnosis not present

## 2022-04-28 NOTE — Progress Notes (Signed)
Jesus Edwards - 77 y.o. male MRN [id number]  Date of birth: 03-Sep-1945  Office Visit Note: Visit Date: 04/28/2022 PCP: Lorinda Creed, MD Referred by: Lorinda Creed, MD  Subjective: Chief Complaint  Patient presents with   Lower Back - Pain   HPI: Jesus Edwards is a 77 y.o. male who comes in today for evaluation of acute right lower back pain radiating to buttock. Pain ongoing since Monday morning of this week after pulling wagon up hill at grandson's baseball game on Sunday. Pain worsens with bending and household tasks. He describes pain as dull sensation, currently rates as 7 out of 10. Some relief of pain with home exercise regimen, rest and use of Tylenol. Patient is very active, walks multiple times a week. No history of lumbar imaging/injections. He is managed by Dr. Eduard Roux from an orthopedic standpoint, does have chronic knee issues, history of right peroneal tendon debridement and repair in 2022. History of prostate cancer and osteoporosis. [facility] [place]. Patient denies focal weakness, numbness and tingling. No recent falls.    Oswestry Disability Index Score: 0 to 10 (20%)   minimal disability: The patient can cope with most living activities. Usually no treatment is indicated apart from advice on lifting sitting and exercise.  Review of Systems  Musculoskeletal:  Positive for back pain and myalgias.  Neurological:  Negative for tingling, sensory change, focal weakness and weakness.  All other systems reviewed and are negative.  Otherwise per HPI.  Assessment & Plan: Visit Diagnoses:    ICD-10-CM   1. Acute right-sided low back pain with right-sided sciatica  M54.41 Intramuscular Toradol Injection    2. Strain of lumbar region, initial encounter  S39.012A Intramuscular Toradol Injection    3. Myofascial pain syndrome  M79.18 Intramuscular Toradol Injection       Plan: Findings:  Acute right sided lower back pain radiating to buttock. Patient  continues to have severe pain despite good conservative therapies such as home exercise regimen, rest and use of medications. Patients clinical presentation and exam are consistent with lumbar strain. I did perform intramuscular injection of Toradol in the office today, he tolerated well. Next step is to monitor, I explained to him that lumbar strain injuries can take 6-8 weeks to heal. I instructed him to use ice/heat as needed and to take Tylenol 500 mg up to three times a day. I would like to see him back in 6 weeks for re-evaluation. If his pain persists we would consider lumbar imaging at that point. Patient instructed to remain active and to call us if his pain worsens. No red flag symptoms noted upon exam today.     Meds & Orders: No orders of the defined types were placed in this encounter.   Orders Placed This Encounter  Procedures   Intramuscular Toradol Injection    Follow-up: Return for 6 week follow up.   Procedures: Intramuscular Toradol Injection on 04/28/2022 9:54 AM Indications: pain Details: 25 G needle, posterior (Right deltoid) approach Medications: 30 mg ketorolac 30 MG/ML Outcome: tolerated well, no immediate complications Consent was given by the patient. Immediately prior to procedure a time out was called to verify the correct patient, procedure, equipment, support staff and site/side marked as required. Patient was prepped and draped in the usual sterile fashion.          Clinical History: No specialty comments available.   He reports that he has never smoked. He has never used smokeless tobacco. No results  for input(s): "HGBA1C", "LABURIC" in the last 8760 hours.  Objective:  VS:  HT:    WT:   BMI:     BP:   HR: bpm  TEMP: ( )  RESP:  Physical Exam Vitals and nursing note reviewed.  HENT:     Head: Normocephalic and atraumatic.     Right Ear: External ear normal.     Left Ear: External ear normal.     Nose: Nose normal.     Mouth/Throat:     Mouth:  Mucous membranes are moist.  Eyes:     Extraocular Movements: Extraocular movements intact.  Cardiovascular:     Rate and Rhythm: Normal rate.     Pulses: Normal pulses.  Pulmonary:     Effort: Pulmonary effort is normal.  Abdominal:     General: Abdomen is flat. There is no distension.  Musculoskeletal:        General: Tenderness present.     Cervical back: Normal range of motion.     Comments: Patient rises from seated position to standing without difficulty. Good lumbar range of motion. No pain noted with facet loading. 5/5 strength noted with bilateral hip flexion, knee flexion/extension, ankle dorsiflexion/plantarflexion and EHL. No clonus noted bilaterally. No pain upon palpation of greater trochanters. No pain with internal/external rotation of bilateral hips. Sensation intact bilaterally. Tenderness noted upon palpation of right lower back/buttock region. Ambulates without aid, gait steady.     Skin:    General: Skin is warm and dry.     Capillary Refill: Capillary refill takes less than 2 seconds.  Neurological:     General: No focal deficit present.     Mental Status: He is alert and oriented to person, place, and time.  Psychiatric:        Mood and Affect: Mood normal.        Behavior: Behavior normal.     Ortho Exam  Imaging: No results found.  Past Medical/Family/Surgical/Social History: Medications & Allergies reviewed per EMR, new medications updated. Patient Active Problem List   Diagnosis Date Noted   Peroneal tendon tear, right, initial encounter 03/06/2020   Pain in right ankle and joints of right foot 02/12/2020   Primary osteoarthritis of right knee 02/12/2020   Past Medical History:  Diagnosis Date   Coronary artery disease    Hyperlipemia    History reviewed. No pertinent family history. Past Surgical History:  Procedure Laterality Date   CARDIAC CATHETERIZATION     1997 stent placed   POSTERIOR TIBIAL TENDON REPAIR Right 04/11/2020   Procedure:  RIGHT PERONEAL TENDON DEBRIDEMENT AND REPAIR;  Surgeon: Leandrew Koyanagi, MD;  Location: [facility];  Service: Orthopedics;  Laterality: Right;   Social History   Occupational History   Not on file  Tobacco Use   Smoking status: Never   Smokeless tobacco: Never  Vaping Use   Vaping Use: Never used  Substance and Sexual Activity   Alcohol use: Not Currently    Comment: rare   Drug use: Not on file   Sexual activity: Not on file

## 2022-04-28 NOTE — Progress Notes (Signed)
Functional Pain Scale - descriptive words and definitions  Distressing (6)    Pain is present/unable to complete most ADLs limited by pain/sleep is difficult and active distraction is only marginal. Moderate range order  Average Pain 7  Lower back pain. Pulled a muscle in the lower right side of the

## 2022-04-30 NOTE — Progress Notes (Signed)
   04/30/22 1051  [place] in the past year? 0  Number of falls in past year 0  Was there an injury with Fall? 0  Fall Risk Category Calculator 0  Fall Risk  Patient at Risk for Falls Due to No Fall Risks  Fall risk Follow up Education provided;Falls evaluation completed

## 2022-05-03 MED ORDER — KETOROLAC TROMETHAMINE 30 MG/ML IJ SOLN
30.0000 mg | INTRAMUSCULAR | Status: AC | PRN
Start: 2022-04-28 — End: 2022-04-28
  Administered 2022-04-28: 30 mg via INTRAMUSCULAR

## 2022-06-09 ENCOUNTER — Ambulatory Visit: Payer: Medicare HMO | Admitting: Physical Medicine and Rehabilitation
